# Patient Record
Sex: Female | Born: 1995 | Race: White | Hispanic: No | Smoking: Current every day smoker
Health system: Southern US, Community
[De-identification: ages and names within clinical notes are randomized; demographics above are authoritative.]

## PROBLEM LIST (undated history)

## (undated) DIAGNOSIS — K589 Irritable bowel syndrome without diarrhea: Secondary | ICD-10-CM

## (undated) DIAGNOSIS — E785 Hyperlipidemia, unspecified: Secondary | ICD-10-CM

## (undated) DIAGNOSIS — F32A Depression, unspecified: Secondary | ICD-10-CM

## (undated) DIAGNOSIS — F329 Major depressive disorder, single episode, unspecified: Secondary | ICD-10-CM

## (undated) HISTORY — DX: Major depressive disorder, single episode, unspecified: F32.9

## (undated) HISTORY — DX: Hyperlipidemia, unspecified: E78.5

## (undated) HISTORY — DX: Irritable bowel syndrome, unspecified: K58.9

## (undated) HISTORY — DX: Depression, unspecified: F32.A

---
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
## 2015-03-10 ENCOUNTER — Ambulatory Visit (INDEPENDENT_AMBULATORY_CARE_PROVIDER_SITE_OTHER): Payer: BLUE CROSS/BLUE SHIELD | Admitting: Family Medicine

## 2015-03-10 ENCOUNTER — Encounter: Payer: Self-pay | Admitting: Family Medicine

## 2015-03-10 VITALS — BP 100/64 | Ht 64.5 in | Wt 120.0 lb

## 2015-03-10 DIAGNOSIS — M6249 Contracture of muscle, multiple sites: Secondary | ICD-10-CM | POA: Diagnosis not present

## 2015-03-10 DIAGNOSIS — M62838 Other muscle spasm: Secondary | ICD-10-CM | POA: Insufficient documentation

## 2015-03-10 MED ORDER — NAPROXEN 500 MG PO TABS: 500.0000 mg | ORAL_TABLET | Freq: Two times a day (BID) | ORAL | Status: DC

## 2015-03-10 MED ORDER — PREDNISONE 20 MG PO TABS: ORAL_TABLET | ORAL | Status: DC

## 2015-03-10 NOTE — Patient Instructions (Signed)
Take prednisone as directed below. Use heat followed by stretching If symptoms are not improving after 10-14 days make an appointment for physical therapy If symptoms do not improve after 4-5 sessions of physical therapy make a follow-up appointment  Prednisone: Take 2 tablets (40mg ) for 3 days then 1 tablet (20mg ) for 4 days then stop
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
## 2015-05-01 ENCOUNTER — Ambulatory Visit: Payer: BLUE CROSS/BLUE SHIELD | Attending: Family Medicine | Admitting: Physical Therapy

## 2015-05-01 DIAGNOSIS — M542 Cervicalgia: Secondary | ICD-10-CM
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
## 2015-05-03 ENCOUNTER — Ambulatory Visit: Payer: BLUE CROSS/BLUE SHIELD | Admitting: Physical Therapy

## 2015-05-03 ENCOUNTER — Encounter: Payer: Self-pay | Admitting: Physical Therapy

## 2015-05-03 DIAGNOSIS — M542 Cervicalgia: Secondary | ICD-10-CM | POA: Diagnosis not present

## 2015-05-03 NOTE — Patient Instructions (Signed)
AROM: Neck Rotation   Turn head slowly to look over one shoulder, then the other. Hold each position _10___ seconds. Repeat _5___ times per set. Do __2__ sets per session. Do _2-3___ sessions per day.  http://orth.exer.us/294   Copyright  VHI. All rights reserved.  AROM: Lateral Neck Flexion   Slowly tilt head toward one shoulder, then the other. Hold each position _10___ seconds. Repeat __5__ times per set. Do __2__ sets per session. Do __2-3__ sessions per day.  http://orth.exer.us/296   Copyright  VHI. All rights reserved.  Stretch Break - Chin Tuck   Looking straight forward, tuck chin and hold __10__ seconds. Relax and return to starting position. Repeat __5-10__ times every _3-4___ hours.  Copyright  VHI. All rights reserved.  Stretch Break - Chest and Shoulder Stretch   Maintaining erect posture, draw shoulders back while bringing elbows back and inward. Return to starting position. Repeat __10-20__ times every _3-4___ hours.  Copyright  VHI. All rights reserved.    

## 2015-05-03 NOTE — Therapy (Signed)
[facility] [place], [place], [address] Phone: [phone]   Fax:  [phone]  Physical Therapy Treatment  Patient Details  Name: Diane Russo MRN: [id number] Date of Birth: [birth year] Referring Provider:  Henreitta Cea, MD  Encounter Date: 05/03/2015      PT End of Session - 05/03/15 1158    Visit Number 2   Number of Visits 12   Date for PT Re-Evaluation 06/26/15   PT Start Time 1115   PT Stop Time 1158   PT Time Calculation (min) 43 min   Activity Tolerance Patient tolerated treatment well   Behavior During Therapy [facility] for tasks assessed/performed      History reviewed. No pertinent past medical history.  History reviewed. No pertinent past surgical history.  There were no vitals filed for this visit.  Visit Diagnosis:  Neck pain      Subjective Assessment - 05/03/15 1120    Subjective pain range is 0 up to 8/10 esp when at work.    Patient Stated Goals Have no neck pain.   Currently in Pain? Yes   Pain Score 8    Pain Location Neck   Pain Orientation Right;Left   Pain Descriptors / Indicators Aching   Pain Onset More than a month ago   Aggravating Factors  work activities   Pain Relieving Factors rest                         [facility] Adult PT Treatment/Exercise - 05/03/15 0001    Exercises   Exercises Neck   Neck Exercises: Seated   Cervical Rotation Both;10 reps   Lateral Flexion Both;10 reps   Other Seated Exercise chin tucks 10sec x10   Other Seated Exercise scap retraction 2x10   Modalities   Modalities Moist Heat;Electrical Stimulation;Ultrasound   Moist Heat Therapy   Number Minutes Moist Heat 15 Minutes   Moist Heat Location Cervical   Electrical Stimulation   Electrical Stimulation Location cercical   Electrical Stimulation Action premod   Electrical Stimulation Parameters 1-10HZ    Electrical Stimulation Goals Pain   Ultrasound   Ultrasound Location c-spine  paraspinals   Ultrasound Parameters 1.5w/cm2/50%/65mz x 10 min   Ultrasound Goals Pain                PT Education - 05/03/15 1131    Education provided Yes   Education Details HEP Posture   Person(s) Educated Patient   Methods Explanation;Demonstration;Handout   Comprehension Verbalized understanding;Returned demonstration             PT Long Term Goals - 05/01/15 1150    PT LONG TERM GOAL #1   Title Ind with HEP.   Time 6   Period Weeks   Status New   PT LONG TERM GOAL #2   Title Bilateral active cervical rotation to 85 degrees.   Time 6   Period Weeks   PT LONG TERM GOAL #3   Title Perform ADL's with pain not > 2/10.   Time 6   Period Weeks   Status New   PT LONG TERM GOAL #4   Title Perform work actiivties with pain not > 2/10.   Time 6   Period Weeks   Status New               Plan - 05/03/15 1159    Clinical Impression Statement Patient tolerated treatment very well today. Educated patient with posture techniques  and cervical ex's with HEP given. Pain can increase very high after and while at work. no further goals met today due to ROM and pain deficits.   Pt will benefit from skilled therapeutic intervention in order to improve on the following deficits Pain;Decreased activity tolerance   Rehab Potential Excellent   PT Frequency 2x / week   PT Duration 6 weeks   PT Treatment/Interventions Electrical Stimulation;Moist Heat;Ultrasound;Therapeutic exercise;Manual techniques   PT Next Visit Plan Modalities to bilateral UT's and STW/M and McKenzie exercises.   Consulted and Agree with Plan of Care Patient        Problem List Patient Active Problem List   Diagnosis Date Noted  . Cervical paraspinal muscle spasm 03/10/2015    Phillips Climes, PTA 05/03/2015, 12:05 PM  [facility] [address] [place], [place], [address] Phone: [phone]   Fax:  [phone]

## 2015-05-05 ENCOUNTER — Ambulatory Visit: Payer: BLUE CROSS/BLUE SHIELD | Admitting: Physical Therapy

## 2015-05-05 ENCOUNTER — Encounter: Payer: Self-pay | Admitting: Physical Therapy

## 2015-05-05 DIAGNOSIS — M542 Cervicalgia: Secondary | ICD-10-CM | POA: Diagnosis not present

## 2015-05-05 NOTE — Therapy (Signed)
[facility] [place], [place], [address] Phone: [phone]   Fax:  [phone]  Physical Therapy Treatment  Patient Details  Name: Diane Russo MRN: [id number] Date of Birth: 1996/05/26 Referring Provider:  Henreitta Cea, MD  Encounter Date: 05/05/2015      PT End of Session - 05/05/15 1046    Visit Number 3   Number of Visits 12   Date for PT Re-Evaluation 06/26/15   PT Start Time 1030   PT Stop Time 1059   PT Time Calculation (min) 29 min   Activity Tolerance Patient tolerated treatment well   Behavior During Therapy [facility] for tasks assessed/performed      History reviewed. No pertinent past medical history.  History reviewed. No pertinent past surgical history.  There were no vitals filed for this visit.  Visit Diagnosis:  Neck pain      Subjective Assessment - 05/05/15 1033    Subjective less popping in neck noted. 0 up to 8/10 with work activity. Has not went back to work yet work all weekend   Currently in Pain? Yes   Pain Score 8    Pain Location Neck   Pain Orientation Right;Left   Pain Descriptors / Indicators Aching   Pain Onset More than a month ago   Aggravating Factors  work activities   Pain Relieving Factors rest                         [facility] Adult PT Treatment/Exercise - 05/05/15 0001    Neck Exercises: Seated   Cervical Isometrics --   Modalities   Modalities Moist Heat;Electrical Stimulation;Ultrasound   Moist Heat Therapy   Number Minutes Moist Heat 15 Minutes   Moist Heat Location Cervical   Electrical Stimulation   Electrical Stimulation Action premod   Electrical Stimulation Parameters 1-_0    Electrical Stimulation Goals Pain   Ultrasound   Ultrasound Location c-spine paraspinals   Ultrasound Parameters 1.5w/cm2/50%/45mz x 10 min   Ultrasound Goals Pain                     PT Long Term Goals - 05/05/15 1050    PT LONG TERM GOAL #1   Title Ind with HEP.   Time 6   Period Weeks   Status On-going   PT LONG TERM GOAL #2   Title Bilateral active cervical rotation to 85 degrees.   Time 6   Period Weeks   Status On-going   PT LONG TERM GOAL #3   Title Perform ADL's with pain not > 2/10.   Time 6   Period Weeks   Status On-going   PT LONG TERM GOAL #4   Title Perform work actiivties with pain not > 2/10.   Time 6   Period Weeks   Status On-going  8/10               Plan - 05/05/15 1047    Clinical Impression Statement Pateint tolerated treatment very well today. No exercises today due to patient working this weekend. Will see how patient feels next week after work schedule and may add more ther ex if patient pain level has decreased. No further goals met today due to pain limitration with work activity. Slight forward posture today.   Pt will benefit from skilled therapeutic intervention in order to improve on the following deficits Pain;Decreased activity tolerance   Rehab Potential Excellent   PT  Frequency 2x / week   PT Duration 6 weeks   PT Treatment/Interventions Electrical Stimulation;Moist Heat;Ultrasound;Therapeutic exercise;Manual techniques   PT Next Visit Plan Modalities to bilateral UT's and STW/M and McKenzie exercises. (cervical isometrics and UBE with posture focus)   Consulted and Agree with Plan of Care Patient        Problem List Patient Active Problem List   Diagnosis Date Noted  . Cervical paraspinal muscle spasm 03/10/2015    Phillips Climes, PTA 05/05/2015, 11:08 AM  [facility] [place], [place], [address] Phone: [phone]   Fax:  [phone]

## 2015-05-08 ENCOUNTER — Encounter: Payer: Self-pay | Admitting: *Deleted

## 2015-05-08 ENCOUNTER — Ambulatory Visit: Payer: BLUE CROSS/BLUE SHIELD | Admitting: *Deleted

## 2015-05-08 DIAGNOSIS — M542 Cervicalgia: Secondary | ICD-10-CM

## 2015-05-08 NOTE — Therapy (Signed)
[facility] [address] [place], [place], [address] Phone: [phone]   Fax:  [phone]  Physical Therapy Treatment  Patient Details  Name: Diane Russo MRN: [id number] Date of Birth: [birth year] Referring Provider:  Duard Brady, MD  Encounter Date: 05/08/2015      PT End of Session - 05/08/15 1131    Visit Number 4   Date for PT Re-Evaluation 06/26/15   PT Start Time 1030   PT Stop Time 1121   PT Time Calculation (min) 51 min      History reviewed. No pertinent past medical history.  History reviewed. No pertinent past surgical history.  There were no vitals filed for this visit.  Visit Diagnosis:  Neck pain      Subjective Assessment - 05/08/15 1044    Subjective less popping in neck noted. 0 up to 8/10 with work activity. Has not went back to work yet work all weekend   Patient Stated Goals Have no neck pain.   Currently in Pain? Yes   Pain Location Neck   Pain Orientation Right;Left   Pain Descriptors / Indicators Aching   Pain Onset More than a month ago   Pain Frequency Constant   Aggravating Factors  work activities   Pain Relieving Factors rest                         [facility] Adult PT Treatment/Exercise - 05/08/15 0001    Modalities   Modalities Moist Heat;Electrical Stimulation;Ultrasound   Moist Heat Therapy   Number Minutes Moist Heat 15 Minutes   Moist Heat Location Cervical   Electrical Stimulation   Electrical Stimulation Location cercical   Electrical Stimulation Action IFC   Electrical Stimulation Parameters 80-150hz    Electrical Stimulation Goals Pain   Ultrasound   Ultrasound Location Cervical paras and Utraps LT/RT   Ultrasound Parameters 1.5 w/cm2 x 10 mins in sitting   Ultrasound Goals Pain   Manual Therapy   Manual Therapy Myofascial release;Soft tissue mobilization   Myofascial Release IASTM and STW to BIl. Utraps, levator, and cevical paras with TPR to cerv  paras in sitting                     PT Long Term Goals - 05/05/15 1050    PT LONG TERM GOAL #1   Title Ind with HEP.   Time 6   Period Weeks   Status On-going   PT LONG TERM GOAL #2   Title Bilateral active cervical rotation to 85 degrees.   Time 6   Period Weeks   Status On-going   PT LONG TERM GOAL #3   Title Perform ADL's with pain not > 2/10.   Time 6   Period Weeks   Status On-going   PT LONG TERM GOAL #4   Title Perform work actiivties with pain not > 2/10.   Time 6   Period Weeks   Status On-going  8/10               Plan - 05/08/15 1132    Clinical Impression Statement Pt did great with Rx today and had good TPR bil cervical paras. She still has a slight rounded shoulder forward head posture, so posture exs were reviewed.Decreased pain after Rx. Notable tightness both sides RT>LT   Pt will benefit from skilled therapeutic intervention in order to improve on the following deficits Pain;Decreased activity tolerance   Rehab Potential  Excellent   PT Frequency 2x / week   PT Duration 6 weeks   PT Treatment/Interventions Electrical Stimulation;Moist Heat;Ultrasound;Therapeutic exercise;Manual techniques   PT Next Visit Plan Modalities to bilateral UT's and STW/M and McKenzie exercises. (cervical isometrics and UBE with posture focus)        Problem List Patient Active Problem List   Diagnosis Date Noted  . Cervical paraspinal muscle spasm 03/10/2015    Kemp Gomes,CHRISPTA 05/08/2015, 11:46 AM  [facility] [address] [place], [place], [address] Phone: [phone]   Fax:  [phone]

## 2015-05-10 ENCOUNTER — Ambulatory Visit: Payer: BLUE CROSS/BLUE SHIELD | Admitting: *Deleted

## 2015-05-10 ENCOUNTER — Encounter: Payer: Self-pay | Admitting: *Deleted

## 2015-05-10 DIAGNOSIS — M542 Cervicalgia: Secondary | ICD-10-CM | POA: Diagnosis not present

## 2015-05-10 NOTE — Therapy (Signed)
[facility] [address] [place], [place], [address] Phone: [phone]   Fax:  [phone]  Physical Therapy Treatment  Patient Details  Name: Diane Russo MRN: [id number] Date of Birth: 11-Feb-1996 Referring Provider:  Duard Brady, MD  Encounter Date: 05/10/2015      PT End of Session - 05/10/15 1257    Visit Number 5   Number of Visits 12   Date for PT Re-Evaluation 06/26/15   PT Start Time 1030   PT Stop Time 1118   PT Time Calculation (min) 48 min      History reviewed. No pertinent past medical history.  History reviewed. No pertinent past surgical history.  There were no vitals filed for this visit.  Visit Diagnosis:  Neck pain      Subjective Assessment - 05/10/15 1231    Subjective Did ok after last Rx. Neck started hurting when looking down rinsing some shirts.   Patient Stated Goals Have no neck pain.   Pain Score 6    Pain Location Neck   Pain Orientation Right;Left   Pain Descriptors / Indicators Aching   Pain Onset More than a month ago   Pain Frequency Intermittent   Aggravating Factors  work ACts   Pain Relieving Factors rest                         [facility] Adult PT Treatment/Exercise - 05/10/15 0001    Exercises   Exercises Neck   Neck Exercises: Standing   Neck Retraction 5 reps   Other Standing Exercises postural stretch with Pt standing. Scapular retractions with shldrs in  ER and focusing on correct postue throughout the day   Neck Exercises: Seated   Postural Training Reviewed correct posture to avoid forward head and rounded shldrs   Other Seated Exercise Levator scapulae stretching x5 hold 30 secs   Manual Therapy   Manual Therapy Myofascial release;Soft tissue mobilization;Passive ROM   Myofascial Release STW to BIl. Utraps, levator, and cevical paras with TPR to cerv paras in supine   Passive ROM Passive cervical stretching for rotation Bilateral.  with  end-range  holds                      PT Long Term Goals - 05/05/15 1050    PT LONG TERM GOAL #1   Title Ind with HEP.   Time 6   Period Weeks   Status On-going   PT LONG TERM GOAL #2   Title Bilateral active cervical rotation to 85 degrees.   Time 6   Period Weeks   Status On-going   PT LONG TERM GOAL #3   Title Perform ADL's with pain not > 2/10.   Time 6   Period Weeks   Status On-going   PT LONG TERM GOAL #4   Title Perform work actiivties with pain not > 2/10.   Time 6   Period Weeks   Status On-going  8/10               Plan - 05/10/15 1258    Clinical Impression Statement Pt did fairly well today with Rx    Pt will benefit from skilled therapeutic intervention in order to improve on the following deficits Pain;Decreased activity tolerance   Rehab Potential Excellent   PT Frequency 2x / week   PT Duration 6 weeks   PT Treatment/Interventions Electrical Stimulation;Moist Heat;Ultrasound;Therapeutic exercise;Manual techniques  PT Next Visit Plan Modalities to bilateral UT's and STW/M and McKenzie exercises. (cervical isometrics and UBE with posture focus) Manual passive stretches        Problem List Patient Active Problem List   Diagnosis Date Noted  . Cervical paraspinal muscle spasm 03/10/2015    RAMSEUR,CHRIS, PTA 05/10/2015, 2:26 PM  [facility] [address] [place], [place], [address] Phone: [phone]   Fax:  [phone]

## 2015-05-16 ENCOUNTER — Encounter: Payer: Self-pay | Admitting: Physical Therapy

## 2015-05-16 ENCOUNTER — Ambulatory Visit: Payer: BLUE CROSS/BLUE SHIELD | Admitting: Physical Therapy

## 2015-05-16 DIAGNOSIS — M542 Cervicalgia: Secondary | ICD-10-CM | POA: Diagnosis not present

## 2015-05-16 NOTE — Therapy (Signed)
[facility] [address] [place], [place], [address] Phone: [phone]   Fax:  [phone]  Physical Therapy Treatment  Patient Details  Name: Diane Russo MRN: [id number] Date of Birth: May 12, 1996 Referring Provider:  Duard Brady, MD  Encounter Date: 05/16/2015      PT End of Session - 05/16/15 1133    Visit Number 6   Number of Visits 12   Date for PT Re-Evaluation 06/26/15   PT Start Time 1117   PT Stop Time 1154   PT Time Calculation (min) 37 min   Activity Tolerance Patient tolerated treatment well   Behavior During Therapy [facility] for tasks assessed/performed      No past medical history on file.  No past surgical history on file.  There were no vitals filed for this visit.  Visit Diagnosis:  Neck pain      Subjective Assessment - 05/16/15 1130    Subjective Patient states that she doesn't hurt too bad today. States that during work activities when pain is bad the pain is a sore feeling and makes her feel like she has to pop her neck. Requests no more STW due to increased pain following last sessions of STW.   Patient Stated Goals Have no neck pain.   Currently in Pain? Yes   Pain Score 2    Pain Location Neck   Pain Orientation Right   Pain Descriptors / Indicators Dull   Pain Onset More than a month ago            [facility] PT Assessment - 05/16/15 0001    Assessment   Medical Diagnosis Cervical paraspinal muscle spasm.   Onset Date/Surgical Date 01/20/15                     [facility] Adult PT Treatment/Exercise - 05/16/15 0001    Modalities   Modalities Electrical Stimulation;Moist Heat;Ultrasound   Moist Heat Therapy   Number Minutes Moist Heat 20 Minutes   Moist Heat Location Cervical   Electrical Stimulation   Electrical Stimulation Location R Upper Trap   Electrical Stimulation Action Pre-Mod   Electrical Stimulation Parameters 80-150 Hz x20 min   Electrical Stimulation Goals Pain   Ultrasound   Ultrasound Location R UT   Ultrasound Parameters 1.5 w/cm2, 100%, 1 mhz   Ultrasound Goals Pain                PT Education - 05/16/15 1202    Education provided Yes   Education Details Encouraged patient to be aware of posture and HEP completion.   Person(s) Educated Patient   Methods Explanation   Comprehension Verbalized understanding             PT Long Term Goals - 05/05/15 1050    PT LONG TERM GOAL #1   Title Ind with HEP.   Time 6   Period Weeks   Status On-going   PT LONG TERM GOAL #2   Title Bilateral active cervical rotation to 85 degrees.   Time 6   Period Weeks   Status On-going   PT LONG TERM GOAL #3   Title Perform ADL's with pain not > 2/10.   Time 6   Period Weeks   Status On-going   PT LONG TERM GOAL #4   Title Perform work actiivties with pain not > 2/10.   Time 6   Period Weeks   Status On-going  8/10  Plan - 05/16/15 1155    Clinical Impression Statement Patient tolerated treatment well today wihtout complaint of increased pain during treatment. Modalities completed with no STW per patient request. Trigger point noted in R Upper Trapezius during ultrasound as the soundhead passed over it. No reports of tenderness or pain expressed during treatment. Normal modaltiies respones noted following removal of the modalties. Denied pain following treatment and reported decrease tightness.   Pt will benefit from skilled therapeutic intervention in order to improve on the following deficits Pain;Decreased activity tolerance   Rehab Potential Excellent   PT Frequency 2x / week   PT Duration 6 weeks   PT Treatment/Interventions Electrical Stimulation;Moist Heat;Ultrasound;Therapeutic exercise;Manual techniques   PT Next Visit Plan Continue per MPT POC.   Consulted and Agree with Plan of Care Patient        Problem List Patient Active Problem List   Diagnosis Date Noted  . Cervical paraspinal muscle spasm  03/10/2015    Evelene CroonKelsey M Parsons, PTA 05/16/2015, 12:04 PM  [facility] [address] [place], [place], [address] Phone: [phone]   Fax:  [phone]

## 2015-05-18 ENCOUNTER — Ambulatory Visit: Payer: BLUE CROSS/BLUE SHIELD | Admitting: Physical Therapy

## 2015-05-18 DIAGNOSIS — M542 Cervicalgia: Secondary | ICD-10-CM | POA: Diagnosis not present

## 2015-05-18 NOTE — Therapy (Addendum)
[facility] [place], [place], [address] Phone: [phone]   Fax:  [phone]  Physical Therapy Treatment  Patient Details  Name: Diane Russo MRN: [id number] Date of Birth: [birth year] Referring Provider:  Henreitta Cea, MD  Encounter Date: 05/18/2015      PT End of Session - 05/18/15 0948    Visit Number 7   Number of Visits 12   Date for PT Re-Evaluation 06/26/15   PT Start Time 0947   PT Stop Time 1029   PT Time Calculation (min) 42 min   Activity Tolerance Patient tolerated treatment well   Behavior During Therapy [facility] for tasks assessed/performed      No past medical history on file.  No past surgical history on file.  There were no vitals filed for this visit.  Visit Diagnosis:  Neck pain      Subjective Assessment - 05/18/15 0948    Subjective Patient has no pain today.                          [facility] Adult PT Treatment/Exercise - 05/18/15 0001    Exercises   Exercises Shoulder   Neck Exercises: Theraband   Horizontal ABduction --  2# bil   Neck Exercises: Seated   Neck Retraction 10 reps  VCs to avoid excessive protraction   Neck Exercises: Prone   Rows 20 reps;Weights   Rows Weights (lbs) 4   Other Prone Exercise T's 2# and 1# 1 x 10 and Ys 1# 2 x 10  feels in lev scap (not pain)   Shoulder Exercises: Supine   Horizontal ABduction Strengthening;Both;20 reps;Weights   Horizontal ABduction Weight (lbs) 2  Ts and Ys   External Rotation Strengthening;Both;20 reps;Weights  Robber position   External Rotation Weight (lbs) 2   Shoulder Exercises: Standing   Horizontal ABduction Strengthening;Both;20 reps;Weights   Horizontal ABduction Weight (lbs) 2   Other Standing Exercises Flex/Scap/Abd 2# x 10 reps each   Other Standing Exercises lawnmower from plynth 2 # x 20    Manual Therapy   Manual Therapy Taping   Kinesiotex Inhibit Muscle   Kinesiotix   Inhibit Muscle  Rock  tape to Rt lev scap and across upper back Acromion to acromion   Neck Exercises: Stretches   Levator Stretch 1 rep;30 seconds  reviewed for proper form                PT Education - 05/18/15 1057    Education provided Yes   Education Details T's/Ys; rows, Gaffer, Equities trader for [employer]; [employer] education precautions; how to take off   Person(s) Educated Patient   Methods Explanation;Demonstration;Handout   Comprehension Verbalized understanding;Returned demonstration             PT Long Term Goals - 05/18/15 1104    PT LONG TERM GOAL #1   Title Ind with HEP.   Time 6   Period Weeks   Status On-going   PT LONG TERM GOAL #3   Title Perform ADL's with pain not > 2/10.   Time 6   Period Weeks   Status On-going   PT LONG TERM GOAL #4   Title Perform work actiivties with pain not > 2/10.   Time 6   Period Weeks   Status On-going               Plan - 05/18/15 1100    Clinical Impression Statement Patient  had no paiin today, but hadn't worked yet. Patient tolerated introduction of shoulder strengthening without increased pain. Patient demos good scapular control with therex. Rock tape applied to right [employer] and across upper back to see if helps control pain at work later today.  HEP progressed to shoulder and upper back strengthening.   PT Next Visit Plan assess rock tape, continue strengthening; modalities prn   Consulted and Agree with Plan of Care Patient        Problem List Patient Active Problem List   Diagnosis Date Noted  . Cervical paraspinal muscle spasm 03/10/2015    Madelyn Flavors PT  05/18/2015, 11:06 AM  [facility] [place], [place], [address] Phone: [phone]   Fax:  [phone] PHYSICAL THERAPY DISCHARGE SUMMARY  Visits from Start of Care:  7.  Current functional level related to goals / functional outcomes: Please see above.   Remaining deficits: Continued neck  pain.   Education / Equipment: HEP. Plan: Patient agrees to discharge.  Patient goals were not met. Patient is being discharged due to being pleased with the current functional level.  ?????        Mali Applegate MPT

## 2015-05-18 NOTE — Patient Instructions (Signed)
Abduction: Horizontal - Prone (Dumbbell)   Lie with right arm hanging down. Lift arm out to side, PALM DOWN. Repeat _10___ times per set. Do __1-3__ sets per session. Do __1__ sessions per day. Use _1___ lb weight.  Repeat with palm facing down toward floor.  Straight Arm Lift: Prone   Arm straight out diagonally. Keeping PALM DOWN, lift arm. Keep pelvis still. You can add light weight. (1#) Do _10-30__ times, 1_ times per day.   http://ss.exer.us/311   Robber: Scapular Retraction: Elbow Flexion (SITTING)   With elbows bent to 90, pinch shoulder blades together and rotate arms out, keeping elbows bent. Repeat _10___ times per set. Do _1-3___ sets per session. Do __1__ sessions per day. May use _1-2____ pound weights.  http://orth.exer.us/948   Copyright  VHI. All rights reserved.   LAWNMOWER!!! With 2# Weight ROWS 4# LAYING ON STOMACH  Solon PalmJulie Alec Mcphee, PT 05/18/2015 10:29 AM [facility] [address] [place], [place], [address] Phone: [phone]   Fax:  [phone]

## 2015-11-21 ENCOUNTER — Ambulatory Visit (INDEPENDENT_AMBULATORY_CARE_PROVIDER_SITE_OTHER): Payer: BLUE CROSS/BLUE SHIELD | Admitting: Sports Medicine

## 2015-11-21 ENCOUNTER — Encounter: Payer: Self-pay | Admitting: Sports Medicine

## 2015-11-21 VITALS — BP 100/70 | Ht 64.5 in | Wt 120.0 lb

## 2015-11-21 DIAGNOSIS — M6249 Contracture of muscle, multiple sites: Secondary | ICD-10-CM | POA: Diagnosis not present

## 2015-11-21 DIAGNOSIS — S161XXD Strain of muscle, fascia and tendon at neck level, subsequent encounter: Secondary | ICD-10-CM

## 2015-11-21 DIAGNOSIS — M62838 Other muscle spasm: Secondary | ICD-10-CM
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
## 2015-11-25 ENCOUNTER — Ambulatory Visit (HOSPITAL_BASED_OUTPATIENT_CLINIC_OR_DEPARTMENT_OTHER): Payer: BLUE CROSS/BLUE SHIELD

## 2015-12-02 ENCOUNTER — Ambulatory Visit (HOSPITAL_BASED_OUTPATIENT_CLINIC_OR_DEPARTMENT_OTHER): Payer: BLUE CROSS/BLUE SHIELD

## 2015-12-16 ENCOUNTER — Ambulatory Visit (HOSPITAL_BASED_OUTPATIENT_CLINIC_OR_DEPARTMENT_OTHER)
Admission: RE | Admit: 2015-12-16 | Discharge: 2015-12-16 | Disposition: A | Discharge status: Home / Self Care | Payer: BLUE CROSS/BLUE SHIELD | Source: Ambulatory Visit | Attending: Sports Medicine | Admitting: Sports Medicine

## 2015-12-16 DIAGNOSIS — M62838 Other muscle spasm: Secondary | ICD-10-CM

## 2015-12-16 DIAGNOSIS — M542 Cervicalgia: Secondary | ICD-10-CM | POA: Insufficient documentation

## 2015-12-16 DIAGNOSIS — M6249 Contracture of muscle, multiple sites: Secondary | ICD-10-CM | POA: Diagnosis not present

## 2015-12-19 ENCOUNTER — Telehealth: Payer: Self-pay | Admitting: Sports Medicine

## 2015-12-19 NOTE — Telephone Encounter (Signed)
I left a message on the patient's voicemail today regarding MRI findings of her cervical spine. It is unremarkable. She is reassured regarding these findings. I also explained that it is not unusual for these whiplash type of injuries to take several months before resolving. I think she can continue with activity as tolerated and follow-up with me as needed.

## 2016-01-06 LAB — BASIC METABOLIC PANEL
BUN: 6 mg/dL (ref 4–21)
CREATININE: 0.7 mg/dL (ref 0.5–1.1)
GLUCOSE: 72 mg/dL

## 2016-01-06 LAB — HEPATIC FUNCTION PANEL
ALT: 22 U/L (ref 3–30)
AST: 19 U/L (ref 2–40)
Alkaline Phosphatase: 68 U/L (ref 25–125)
BILIRUBIN, TOTAL: 0.4 mg/dL

## 2016-01-06 LAB — LIPID PANEL
Cholesterol: 212 mg/dL — AB (ref 0–200)
HDL: 48 mg/dL (ref 35–70)
LDL Cholesterol: 134 mg/dL
TRIGLYCERIDES: 151 mg/dL (ref 40–160)

## 2016-01-06 LAB — CBC AND DIFFERENTIAL
HEMATOCRIT: 40 % (ref 36–46)
Hemoglobin: 13.5 g/dL (ref 12.0–16.0)
PLATELETS: 250 10*3/uL (ref 150–399)
WBC: 6.6 10^3/mL

## 2016-01-23 ENCOUNTER — Encounter: Payer: Self-pay | Admitting: Internal Medicine

## 2016-01-23 ENCOUNTER — Ambulatory Visit (INDEPENDENT_AMBULATORY_CARE_PROVIDER_SITE_OTHER): Payer: BLUE CROSS/BLUE SHIELD | Admitting: Internal Medicine

## 2016-01-23 VITALS — BP 110/76 | HR 85 | Temp 98.2°F | Resp 16 | Ht 65.75 in | Wt 116.0 lb

## 2016-01-23 DIAGNOSIS — K589 Irritable bowel syndrome without diarrhea: Secondary | ICD-10-CM | POA: Diagnosis not present

## 2016-01-23 DIAGNOSIS — M6249 Contracture of muscle, multiple sites: Secondary | ICD-10-CM | POA: Diagnosis not present

## 2016-01-23 DIAGNOSIS — L748 Other eccrine sweat disorders: Secondary | ICD-10-CM | POA: Diagnosis not present

## 2016-01-23 DIAGNOSIS — F419 Anxiety disorder, unspecified: Secondary | ICD-10-CM | POA: Insufficient documentation

## 2016-01-23 DIAGNOSIS — M62838 Other muscle spasm: Secondary | ICD-10-CM

## 2016-01-23 MED ORDER — ALUMINUM CHLORIDE 20 % EX SOLN: Freq: Every day | CUTANEOUS | Status: AC

## 2016-01-23 NOTE — Progress Notes (Signed)
Pre visit review using our clinic review tool, if applicable. No additional management support is needed unless otherwise documented below in the visit note. 

## 2016-01-23 NOTE — Patient Instructions (Signed)
  We will try a new prescription deodorant.  If it does not work please call me.     Your prescription(s) have been submitted to your pharmacy. Please take as directed and contact our office if you believe you are having problem(s) with the medication(s).   Please followup as needed.
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
## 2016-01-23 NOTE — Assessment & Plan Note (Signed)
Sees psychiatry and they prescribe the medication Will be starting counseling

## 2016-01-23 NOTE — Assessment & Plan Note (Addendum)
Has had full GI evaluation in the past Has abdominal cramping and constipation

## 2016-01-23 NOTE — Assessment & Plan Note (Signed)
Has some degree of hyperhidrosis, but the odor is more of the concern Will try drysol If no improvement she will call ? Bacterial overgrowth in sweat glands - I would expect more physical signs with redness or bumps and she denies this May need derm referral

## 2016-02-08 ENCOUNTER — Encounter: Payer: Self-pay | Admitting: Internal Medicine

## 2016-02-13 ENCOUNTER — Encounter: Payer: Self-pay | Admitting: Sports Medicine

## 2016-02-16 ENCOUNTER — Ambulatory Visit (INDEPENDENT_AMBULATORY_CARE_PROVIDER_SITE_OTHER): Payer: BLUE CROSS/BLUE SHIELD | Admitting: Family Medicine

## 2016-02-16 ENCOUNTER — Telehealth: Payer: Self-pay | Admitting: Internal Medicine

## 2016-02-16 ENCOUNTER — Encounter: Payer: Self-pay | Admitting: Family Medicine

## 2016-02-16 ENCOUNTER — Encounter: Payer: Self-pay | Admitting: *Deleted

## 2016-02-16 VITALS — BP 118/82 | HR 80 | Temp 98.8°F | Resp 16 | Ht 65.75 in | Wt 111.2 lb

## 2016-02-16 DIAGNOSIS — J029 Acute pharyngitis, unspecified: Secondary | ICD-10-CM | POA: Diagnosis not present

## 2016-02-16 LAB — POCT RAPID STREP A (OFFICE): Rapid Strep A Screen: NEGATIVE

## 2016-02-16 MED ORDER — CLINDAMYCIN HCL 300 MG PO CAPS
300.0000 mg | ORAL_CAPSULE | Freq: Three times a day (TID) | ORAL | Status: DC
Start: 2016-02-16 — End: 2022-03-13

## 2016-02-16 NOTE — Telephone Encounter (Signed)
[facility] - Client TELEPHONE ADVICE RECORD [facility]  Patient Name: Diane FillerMILY SHELTON  DOB: 1996-09-26    Initial Comment Caller states, dtr sore throat, headache, fever, needs a sick visit    Nurse Assessment  Nurse: Laural BenesJohnson, RN, Dondra SpryGail Date/Time Lamount Cohen(Eastern Time): 02/16/2016 8:35:44 AM  Confirm and document reason for call. If symptomatic, describe symptoms. You must click the next button to save text entered. ---Irving BurtonEmily has bad sore throat, getting worse last two days fever and headache present .  Has the patient traveled out of the country within the last 30 days? ---No  Does the patient have any new or worsening symptoms? ---Yes  Will a triage be completed? ---Yes  Related visit to physician within the last 2 weeks? ---No  Does the PT have any chronic conditions? (i.e. diabetes, asthma, etc.) ---No  Is the patient pregnant or possibly pregnant? (Ask all females between the ages of 4712-55) ---No  Is this a behavioral health or substance abuse call? ---No     Guidelines    Guideline Title Affirmed Question Affirmed Notes  Sore Throat SEVERE (e.g., excruciating) throat pain    Final Disposition User   See Physician within 24 Hours [place], RN, Dondra SpryGail    Comments  NOTE; [facility]/16/2016 1100am [place], PHILLIP Diane FillerEmily Shelton r.o strep severe sore throat, headache low grade fever   Referrals  REFERRED TO PCP OFFICE   Disagree/Comply: Comply

## 2016-02-16 NOTE — Telephone Encounter (Signed)
ok 

## 2016-02-16 NOTE — Progress Notes (Signed)
Pre visit review using our clinic review tool, if applicable. No additional management support is needed unless otherwise documented below in the visit note. 

## 2016-02-16 NOTE — Telephone Encounter (Signed)
Spoke with pts mother, she informed that [facility].

## 2016-02-16 NOTE — Telephone Encounter (Signed)
Please advise, only possible opening is at [address] which is currently blocked.

## 2016-02-16 NOTE — Progress Notes (Signed)
OFFICE VISIT  02/16/2016   CC:  Chief Complaint  Patient presents with  . Sore Throat    x 2 days   HPI:    Patient is a 20 y.o. Caucasian female who presents for ST that started 2 d/a, getting worse.  +HA.  No nasal sx's or cough.  Fatigue but no body aches.  Fever to 101.5 yesterday. Tylenol/ibuprofen is being tried.  Also tried allergy pill for ST but no help. +sick contacts with similar sx's where she goes to school.  No rash.  No sob or wheezing.  Past Medical History  Diagnosis Date  . Depression   . Hyperlipidemia   . IBS (irritable bowel syndrome)     Past Surgical History  Procedure Laterality Date  . Wisdom tooth extraction  08/2014    Outpatient Prescriptions Prior to Visit  Medication Sig Dispense Refill  . aluminum chloride (DRYSOL) 20 % external solution Apply topically at bedtime. 35 mL 0  . DULoxetine (CYMBALTA) 60 MG capsule Take 60 mg by mouth daily.    Marland Kitchen. NORTREL 1/35, 28, tablet   0   No facility-administered medications prior to visit.    Allergies  Allergen Reactions  . Azithromycin Hives and Rash  . Penicillins Hives and Rash    ROS As per HPI  PE: Blood pressure 118/82, pulse 80, temperature 98.8 F (37.1 C), temperature source Oral, resp. rate 16, height 5' 5.75" (1.67 m), weight 111 lb 4 oz (50.463 kg), last menstrual period 02/02/2016, SpO2 97 %. Gen: Alert, well appearing.  Patient is oriented to person, place, time, and situation. ENT: Ears: EACs clear, normal epithelium.  TMs with good light reflex and landmarks bilaterally.  Eyes: no injection, icteris, swelling, or exudate.  EOMI, PERRLA. Nose: no drainage or turbinate edema/swelling.  No injection or focal lesion.  Mouth: lips without lesion/swelling.  Oral mucosa pink and moist.  Dentition intact and without obvious caries or gingival swelling.  Oropharynx without exudate or swelling.  There is a hint of spotty erythema on soft palate, ? Rare petechiae? Neck - No masses or  thyromegaly or limitation in range of motion.  She has diffuse, mildly tender anterior cerv LAD. CV: RRR, no m/r/g.   LUNGS: CTA bilat, nonlabored resps, good aeration in all lung fields. EXT: no clubbing, cyanosis, or edema.  Skin - no sores or suspicious lesions or rashes or color changes   LABS:  Rapid strep NEG  IMPRESSION AND PLAN:  Acute pharyngitis illness, rapid strep negative but I'm still suspicious of this being group A strep infection.   Influenza also possible but less likely given lack of cough + no body aches. Will start clindamycin (see drug allergies) and send group A strep culture.  An After Visit Summary was printed and given to the patient.  FOLLOW UP: Return if symptoms worsen or fail to improve.  Signed:  Santiago BumpersPhil Lilas Diefendorf, MD           02/16/2016

## 2016-02-16 NOTE — Telephone Encounter (Signed)
States patient is in [place] but is having fever and sore throat.  Mother does not want daughter to be seen by anyone else but Dr. Lawerance BachBurns.  Is requesting Dr. Lawerance BachBurns to work patient in today.

## 2016-02-18 LAB — CULTURE, GROUP A STREP: ORGANISM ID, BACTERIA: NORMAL

## 2016-02-22 ENCOUNTER — Telehealth: Payer: Self-pay | Admitting: Internal Medicine

## 2016-02-22 MED ORDER — POLYMYXIN B-TRIMETHOPRIM 10000-0.1 UNIT/ML-% OP SOLN: 1.0000 [drp] | OPHTHALMIC | Status: AC

## 2016-02-22 NOTE — Telephone Encounter (Signed)
Unable to leave VM on pts phone. Spoke with pts mother to inform RX was sent to [facility]

## 2016-02-22 NOTE — Telephone Encounter (Signed)
Please advise 

## 2016-02-22 NOTE — Telephone Encounter (Signed)
Antibiotic eye drop sent to pharmacy on file.  Use in affected eye ( ? Which one - rx says right)  Take for 5-7 days.  If no improvement needs to be seen.

## 2016-02-22 NOTE — Telephone Encounter (Signed)
Patient Name: Haywood FillerMILY SHELTON DOB: [birth year] Initial Comment Caller states [facility], rx abx. Now congested with eye itching. Now red, puffy, and swollen. Nurse Assessment Nurse: Roma KayserForsythe, RN, Santina Evansatherine Date/Time (Eastern Time): 02/22/2016 10:57:28 AM Confirm and document reason for call. If symptomatic, describe symptoms. You must click the next button to save text entered. ---caller states she was seen friday and dx strep started on abx. and yesterday started with one eye swelling and green discharge this morning, eye is itchy, states that she thinks she has pink eye. Has the patient traveled out of the country within the last 30 days? ---Not Applicable Does the patient have any new or worsening symptoms? ---Yes Will a triage be completed? ---Yes Related visit to physician within the last 2 weeks? ---No Does the PT have any chronic conditions? (i.e. diabetes, asthma, etc.) ---Unknown Is the patient pregnant or possibly pregnant? (Ask all females between the ages of 3612-55) ---No Is this a behavioral health or substance abuse call? ---No Guidelines Guideline Title Affirmed Question Affirmed Notes Eye - Pus or Discharge Blurred vision Final Disposition User See Physician within 4 Hours (or PCP triage) Roma KayserForsythe, RN, Santina Evansatherine Comments no appts left in her pcp office today and does not want to go to a different PCP office, is not sure what she is going to do. Referrals GO TO FACILITY UNDECIDED Disagree/Comply: Comply

## 2016-02-23 ENCOUNTER — Telehealth: Payer: Self-pay

## 2016-02-23 NOTE — Telephone Encounter (Signed)
-----   Message from Jeoffrey MassedPhilip H McGowen, MD sent at 02/19/2016  8:03 AM EDT ----- No sign of bacterial infection on throat culture. Pt may stop her antibiotics.-thx

## 2021-08-13 DIAGNOSIS — Z113 Encounter for screening for infections with a predominantly sexual mode of transmission: Secondary | ICD-10-CM | POA: Diagnosis not present

## 2021-08-13 DIAGNOSIS — Z0389 Encounter for observation for other suspected diseases and conditions ruled out: Secondary | ICD-10-CM | POA: Diagnosis not present

## 2021-08-13 DIAGNOSIS — Z3009 Encounter for other general counseling and advice on contraception: Secondary | ICD-10-CM | POA: Diagnosis not present

## 2021-08-13 DIAGNOSIS — Z3041 Encounter for surveillance of contraceptive pills: Secondary | ICD-10-CM | POA: Diagnosis not present

## 2021-08-13 DIAGNOSIS — Z1388 Encounter for screening for disorder due to exposure to contaminants: Secondary | ICD-10-CM | POA: Diagnosis not present

## 2021-08-13 DIAGNOSIS — Z114 Encounter for screening for human immunodeficiency virus [HIV]: Secondary | ICD-10-CM | POA: Diagnosis not present

## 2021-08-13 DIAGNOSIS — Z01419 Encounter for gynecological examination (general) (routine) without abnormal findings: Secondary | ICD-10-CM | POA: Diagnosis not present

## 2021-08-13 DIAGNOSIS — R87618 Other abnormal cytological findings on specimens from cervix uteri: Secondary | ICD-10-CM | POA: Diagnosis not present

## 2022-03-13 ENCOUNTER — Other Ambulatory Visit: Payer: Self-pay

## 2022-03-13 ENCOUNTER — Encounter (HOSPITAL_COMMUNITY): Payer: Self-pay

## 2022-03-13 ENCOUNTER — Emergency Department (HOSPITAL_COMMUNITY): Payer: BC Managed Care – PPO

## 2022-03-13 ENCOUNTER — Emergency Department (HOSPITAL_COMMUNITY)
Admission: EM | Admit: 2022-03-13 | Discharge: 2022-03-13 | Disposition: A | Discharge status: Home / Self Care | Payer: BC Managed Care – PPO | Attending: Emergency Medicine | Admitting: Emergency Medicine

## 2022-03-13 DIAGNOSIS — J181 Lobar pneumonia, unspecified organism: Secondary | ICD-10-CM | POA: Diagnosis not present

## 2022-03-13 DIAGNOSIS — R509 Fever, unspecified: Secondary | ICD-10-CM | POA: Diagnosis present

## 2022-03-13 DIAGNOSIS — R7401 Elevation of levels of liver transaminase levels: Secondary | ICD-10-CM | POA: Diagnosis not present

## 2022-03-13 DIAGNOSIS — Z20822 Contact with and (suspected) exposure to covid-19: Secondary | ICD-10-CM | POA: Diagnosis not present

## 2022-03-13 DIAGNOSIS — E876 Hypokalemia: Secondary | ICD-10-CM | POA: Diagnosis not present

## 2022-03-13 DIAGNOSIS — E860 Dehydration: Secondary | ICD-10-CM | POA: Diagnosis not present

## 2022-03-13 DIAGNOSIS — R7402 Elevation of levels of lactic acid dehydrogenase (LDH): Secondary | ICD-10-CM | POA: Insufficient documentation

## 2022-03-13 DIAGNOSIS — J189 Pneumonia, unspecified organism: Secondary | ICD-10-CM

## 2022-03-13 LAB — LACTIC ACID, PLASMA
Lactic Acid, Venous: 3.1 mmol/L (ref 0.5–1.9)
Lactic Acid, Venous: 2.7 mmol/L (ref 0.5–1.9)

## 2022-03-13 LAB — CBC WITH DIFFERENTIAL/PLATELET
Abs Immature Granulocytes: 0.03 10*3/uL (ref 0.00–0.07)
Basophils Absolute: 0 10*3/uL (ref 0.0–0.1)
Basophils Relative: 0 %
Eosinophils Absolute: 0 10*3/uL (ref 0.0–0.5)
Eosinophils Relative: 0 %
HCT: 35.8 % — ABNORMAL LOW (ref 36.0–46.0)
Hemoglobin: 12.3 g/dL (ref 12.0–15.0)
Immature Granulocytes: 0 %
Lymphocytes Relative: 9 %
Lymphs Abs: 0.8 10*3/uL (ref 0.7–4.0)
MCH: 27.9 pg (ref 26.0–34.0)
MCHC: 34.4 g/dL (ref 30.0–36.0)
MCV: 81.2 fL (ref 80.0–100.0)
Monocytes Absolute: 0.8 10*3/uL (ref 0.1–1.0)
Monocytes Relative: 8 %
Neutro Abs: 8 10*3/uL — ABNORMAL HIGH (ref 1.7–7.7)
Neutrophils Relative %: 83 %
Platelets: 233 10*3/uL (ref 150–400)
RBC: 4.41 MIL/uL (ref 3.87–5.11)
RDW: 12.8 % (ref 11.5–15.5)
WBC: 9.6 10*3/uL (ref 4.0–10.5)
nRBC: 0 % (ref 0.0–0.2)

## 2022-03-13 LAB — URINALYSIS, ROUTINE W REFLEX MICROSCOPIC
Bilirubin Urine: NEGATIVE
Glucose, UA: NEGATIVE mg/dL
Hgb urine dipstick: NEGATIVE
Ketones, ur: NEGATIVE mg/dL
Leukocytes,Ua: NEGATIVE
Nitrite: NEGATIVE
Protein, ur: NEGATIVE mg/dL
Specific Gravity, Urine: 1.009 (ref 1.005–1.030)
pH: 7 (ref 5.0–8.0)

## 2022-03-13 LAB — COMPREHENSIVE METABOLIC PANEL
ALT: 73 U/L — ABNORMAL HIGH (ref 0–44)
AST: 75 U/L — ABNORMAL HIGH (ref 15–41)
Albumin: 3.5 g/dL (ref 3.5–5.0)
Alkaline Phosphatase: 82 U/L (ref 38–126)
Anion gap: 10 (ref 5–15)
BUN: 8 mg/dL (ref 6–20)
CO2: 22 mmol/L (ref 22–32)
Calcium: 8.7 mg/dL — ABNORMAL LOW (ref 8.9–10.3)
Chloride: 105 mmol/L (ref 98–111)
Creatinine, Ser: 0.74 mg/dL (ref 0.44–1.00)
GFR, Estimated: >60 mL/min (ref >60)
Glucose, Bld: 122 mg/dL — ABNORMAL HIGH (ref 70–99)
Potassium: 3.1 mmol/L — ABNORMAL LOW (ref 3.5–5.1)
Sodium: 137 mmol/L (ref 135–145)
Total Bilirubin: 0.5 mg/dL (ref 0.3–1.2)
Total Protein: 6.8 g/dL (ref 6.5–8.1)

## 2022-03-13 LAB — RESP PANEL BY RT-PCR (FLU A&B, COVID) ARPGX2
Influenza A by PCR: NEGATIVE
Influenza B by PCR: NEGATIVE
SARS Coronavirus 2 by RT PCR: NEGATIVE

## 2022-03-13 MED ORDER — CEFPODOXIME PROXETIL 200 MG PO TABS: 200.0000 mg | ORAL_TABLET | Freq: Two times a day (BID) | ORAL | 0 refills | Status: AC

## 2022-03-13 MED ORDER — POTASSIUM CHLORIDE CRYS ER 20 MEQ PO TBCR
40.0000 meq | EXTENDED_RELEASE_TABLET | Freq: Once | ORAL | Status: AC
  Administered 2022-03-13: 40 meq via ORAL
  Filled 2022-03-13: qty 2

## 2022-03-13 MED ORDER — CEFTRIAXONE SODIUM 1 G IJ SOLR
1.0000 g | Freq: Once | INTRAMUSCULAR | Status: AC
  Administered 2022-03-13: 1 g via INTRAVENOUS
  Filled 2022-03-13: qty 10

## 2022-03-13 MED ORDER — ACETAMINOPHEN 325 MG PO TABS
650.0000 mg | ORAL_TABLET | Freq: Once | ORAL | Status: DC
  Filled 2022-03-13: qty 2

## 2022-03-13 MED ORDER — FLUCONAZOLE 150 MG PO TABS: 150.0000 mg | ORAL_TABLET | Freq: Every day | ORAL | 0 refills | Status: AC

## 2022-03-13 MED ORDER — SODIUM CHLORIDE 0.9 % IV BOLUS
1000.0000 mL | Freq: Once | INTRAVENOUS | Status: AC
  Administered 2022-03-13: 1000 mL via INTRAVENOUS

## 2022-03-13 NOTE — ED Triage Notes (Signed)
Patient with complaints of fever, cough, congestion, and vomiting that started the previous Friday. Was seen at the urgent care on Monday and given antibiotics and steroids.  ?

## 2022-03-13 NOTE — ED Notes (Signed)
Pt up to bathroom.

## 2022-03-13 NOTE — ED Notes (Signed)
Date and time results received: 03/13/22 0926 ?(use smartphrase ".now" to insert current time) ? ?Test: lactic ?Critical Value: 3.1 ? ?Name of Provider Notified: Dr. Audley Hose ? ?Orders Received? Or Actions Taken?: no/na ?

## 2022-03-13 NOTE — ED Provider Notes (Signed)
?[facility] ?Provider Note ? ? ?CSN: [id number] ?Arrival date & time: 03/13/22  3419 ? ?  ? ?History ? ?Chief Complaint  ?Patient presents with  ? Fever  ? ? ?Diane Russo is a 26 y.o. female. ? ?Patient presents ER chief complaint of fever and cough ongoing for 5 days.  She was seen at urgent care 3 days ago and given antibiotics and steroids, however has had persistent fever and cough and presents to the ER.  Denies any headache denies any chest pain other than when she coughs.  No diarrhea reported.  2 episodes of vomiting after coughing per patient.  Nonbloody nonbilious. ? ? ?  ? ?Home Medications ?Prior to Admission medications   ?Medication Sig Start Date End Date Taking? Authorizing Provider  ?cefpodoxime (VANTIN) 200 MG tablet Take 1 tablet (200 mg total) by mouth 2 (two) times daily for 7 days. 03/13/22 03/20/22 Yes Cheryll Cockayne, MD  ?doxycycline (VIBRA-TABS) 100 MG tablet Take 100 mg by mouth 2 (two) times daily. 03/11/22  Yes [provider]  ?ELINEST 0.3-30 MG-MCG tablet Take 1 tablet by mouth daily. 02/18/22  Yes [provider]  ?predniSONE (DELTASONE) 5 MG tablet Take 1 tablet by mouth daily. 6,5,4,3,2,1 03/11/22  Yes [provider]  ?promethazine-dextromethorphan (PROMETHAZINE-DM) 6.25-15 MG/5ML syrup Take 5 mLs by mouth 4 (four) times daily as needed for cough. 03/11/22  Yes [provider]  ?aluminum chloride (DRYSOL) 20 % external solution Apply topically at bedtime. ?Patient not taking: Reported on 03/13/2022 01/23/16   Pincus Sanes, MD  ?trimethoprim-polymyxin b (POLYTRIM) ophthalmic solution Place 1 drop into the right eye every 4 (four) hours. ?Patient not taking: Reported on 03/13/2022 02/22/16   Pincus Sanes, MD  ?   ? ?Allergies    ?Azithromycin and Penicillins   ? ?Review of Systems   ?Review of Systems  ?Constitutional:  Positive for fever.  ?HENT:  Negative for ear pain.   ?Eyes:  Negative for pain.  ?Respiratory:  Positive for cough.    ?Cardiovascular:  Negative for leg swelling.  ?Gastrointestinal:  Negative for abdominal pain.  ?Genitourinary:  Negative for flank pain.  ?Musculoskeletal:  Negative for back pain.  ?Skin:  Negative for rash.  ?Neurological:  Negative for headaches.  ? ?Physical Exam ?Updated Vital Signs ?BP 102/62   Pulse 78   Temp 100.2 ?F (37.9 ?C) (Oral)   Resp 20   Ht 5' 4.5" (1.638 m)   Wt 65.8 kg   LMP 03/13/2022   SpO2 100%   BMI 24.50 kg/m?  ?Physical Exam ?Constitutional:   ?   General: She is not in acute distress. ?   Appearance: Normal appearance.  ?HENT:  ?   Head: Normocephalic.  ?   Nose: Nose normal.  ?Eyes:  ?   Extraocular Movements: Extraocular movements intact.  ?Cardiovascular:  ?   Rate and Rhythm: Tachycardia present.  ?Pulmonary:  ?   Effort: Pulmonary effort is normal.  ?Abdominal:  ?   Tenderness: There is no abdominal tenderness. There is no guarding or rebound.  ?Musculoskeletal:     ?   General: Normal range of motion.  ?   Cervical back: Normal range of motion.  ?Neurological:  ?   General: No focal deficit present.  ?   Mental Status: She is alert. Mental status is at baseline.  ? ? ?ED Results / Procedures / Treatments   ?Labs ?(all labs ordered are listed, but only abnormal results are displayed) ?  Labs Reviewed  ?CBC WITH DIFFERENTIAL/PLATELET - Abnormal; Notable for the following components:  ?    Result Value  ? HCT 35.8 (*)   ? Neutro Abs 8.0 (*)   ? All other components within normal limits  ?COMPREHENSIVE METABOLIC PANEL - Abnormal; Notable for the following components:  ? Potassium 3.1 (*)   ? Glucose, Bld 122 (*)   ? Calcium 8.7 (*)   ? AST 75 (*)   ? ALT 73 (*)   ? All other components within normal limits  ?LACTIC ACID, PLASMA - Abnormal; Notable for the following components:  ? Lactic Acid, Venous 3.1 (*)   ? All other components within normal limits  ?LACTIC ACID, PLASMA - Abnormal; Notable for the following components:  ? Lactic Acid, Venous 2.7 (*)   ? All other components  within normal limits  ?CULTURE, BLOOD (ROUTINE X 2)  ?RESP PANEL BY RT-PCR (FLU A&B, COVID) [id number]  ?CULTURE, BLOOD (ROUTINE X 2)  ?URINE CULTURE  ?URINALYSIS, ROUTINE W REFLEX MICROSCOPIC  ? ? ?EKG ?None ? ?Radiology ?DG Chest 2 View ? ?Result Date: 03/13/2022 ?CLINICAL DATA:  Cough and fever EXAM: CHEST - 2 VIEW COMPARISON:  Chest x-ray 04/24/2011 FINDINGS: Heart size and mediastinum are within normal limits. Focal consolidative opacity visualized in the right lower lobe most consistent with pneumonia. Left lung is clear. No pleural effusions or pneumothorax. IMPRESSION: Right lower lobe pneumonia. Electronically Signed   By: Jannifer Hick M.D.   On: 03/13/2022 08:27   ? ?Procedures ?Procedures  ? ? ?Medications Ordered in ED ?Medications  ?acetaminophen (TYLENOL) tablet 650 mg (650 mg Oral Not Given 03/13/22 0938)  ?potassium chloride SA (KLOR-CON M) CR tablet 40 mEq (has no administration in time range)  ?sodium chloride 0.9 % bolus 1,000 mL (0 mLs Intravenous Stopped 03/13/22 1123)  ?sodium chloride 0.9 % bolus 1,000 mL (1,000 mLs Intravenous New Bag/Given 03/13/22 1014)  ?cefTRIAXone (ROCEPHIN) 1 g in sodium chloride 0.9 % 100 mL IVPB (0 g Intravenous Stopped 03/13/22 1123)  ? ? ?ED Course/ Medical Decision Making/ A&P ?Clinical Course as of 03/13/22 1130  ?Wed Mar 13, 2022  ?0932 DG Chest 2 View [[facility]]  ?  ?Clinical Course User Index ?[[facility]] Audley Hose Eustace Moore, MD  ? ?                        ?Medical Decision Making ?Amount and/or Complexity of Data Reviewed ?Labs: ordered. ?Radiology: ordered. Decision-making details documented in ED Course. ? ?Risk ?OTC drugs. ?Prescription drug management. ? ? ?History obtained also from mother at bedside. ? ?Chart review shows outpatient office visit December 14, 2021. ? ?Cardiac monitoring showing sinus rhythm. ? ?Patient was febrile tachycardic on arrival.  However symptoms appear greatly improved with resuscitation. ? ?Labs show normal white count, chemistry shows mild  hypokalemia.  Liver enzymes mildly elevated at 75 and 73.  Lactic acid elevated at 3.1.  After fluid resuscitation appears improved to 2.7. ? ?Patient given Rocephin here which she tolerated well as well as 2 L bolus of fluids.  O2 saturation ranged from 95 to 99% on room air which is appropriate. ? ?1 view chest x-ray read by myself as right lower lobe infiltrate.  No effusion no edema noted. ? ?I feel she stable for outpatient management.  She did appear slightly dehydrated but appears significantly improved with fluid resuscitation.  Recommended continued increase fluid intake at home and close outpatient follow-up with repeat evaluation in 2  to 3 days by primary care team or urgent care.  We will add Augmentin to her antibiotic regimen, she continues on doxycycline which I advised her to continue. ? ?Commending immediate return for difficulty breathing worsening symptoms weakness or any additional concerns. ? ? ? ? ? ? ? ?Final Clinical Impression(s) / ED Diagnoses ?Final diagnoses:  ?Pneumonia of right lower lobe due to infectious organism  ? ? ?Rx / DC Orders ?ED Discharge Orders   ? ?      Ordered  ?  cefpodoxime (VANTIN) 200 MG tablet  2 times daily       ?Note to Pharmacy: Tolerated IV rocephin without issue  ? 03/13/22 1129  ? ?  ?  ? ?  ? ? ?  ?Cheryll CockayneHong, Justus Droke S, MD ?03/13/22 1130 ? ?

## 2022-03-13 NOTE — Discharge Instructions (Signed)
Follow-up with a primary care doctor or clinic in the next 3 to 4 days. ? ?Return immediately back to the ER if your symptoms are worsening despite new antibiotics or if you have difficulty breathing, weakness numbness or any additional concerns. ?

## 2022-03-13 NOTE — ED Notes (Signed)
Pt back from x-ray.

## 2022-03-14 LAB — URINE CULTURE: Culture: NO GROWTH

## 2022-03-16 ENCOUNTER — Ambulatory Visit
Admission: RE | Admit: 2022-03-16 | Discharge: 2022-03-16 | Disposition: A | Discharge status: Home / Self Care | Payer: BC Managed Care – PPO | Source: Ambulatory Visit | Attending: Nurse Practitioner | Admitting: Nurse Practitioner

## 2022-03-16 ENCOUNTER — Ambulatory Visit (INDEPENDENT_AMBULATORY_CARE_PROVIDER_SITE_OTHER): Payer: BC Managed Care – PPO

## 2022-03-16 VITALS — BP 107/75 | HR 105 | Temp 99.1°F | Resp 18

## 2022-03-16 DIAGNOSIS — J189 Pneumonia, unspecified organism: Secondary | ICD-10-CM

## 2022-03-16 DIAGNOSIS — R059 Cough, unspecified: Secondary | ICD-10-CM

## 2022-03-16 NOTE — ED Provider Notes (Signed)
?[facility] ? ? ? ?CSN: [id number] ?Arrival date & time: 03/16/22  1345 ? ? ?  ? ?History   ?Chief Complaint ?Chief Complaint  ?Patient presents with  ? Follow-up  ?  ER visit April 26 for pneumonia, need follow up chest x ray - Entered by patient  ? Appointment  ?  1400  ? ? ?HPI ?Diane Russo is a 26 y.o. female.  ? ?The patient is a 26 year old female who presents for a chest x-ray.  Patient states she was seen in the ER on 4/26 and diagnosed with pneumonia, and was told to follow-up in 3 to 4 days for repeat chest x-ray.  The patient was previously diagnosed with a sinus infection and started on doxycycline.  When she went to the ER, she was started on cefpodoxime and is currently still taking the doxycycline.  The patient's mother states she is continuing to spike fevers.  Her last fever was 1 to 2 days ago and was as high as 102.  She states that she had a low-grade fever around 100 this morning.  The patient continues to have cough, denies shortness of breath, difficulty breathing.  Patient's mother states that they also drew blood cultures but she was awaiting the results.  Denies any new symptoms at this time. ? ?The history is provided by the patient and a parent.  ? ?Past Medical History:  ?Diagnosis Date  ? Depression   ? Hyperlipidemia   ? IBS (irritable bowel syndrome)   ? ? ?Patient Active Problem List  ? Diagnosis Date Noted  ? Anxiety 01/23/2016  ? IBS (irritable bowel syndrome) 01/23/2016  ? Axillary odor 01/23/2016  ? Cervical paraspinal muscle spasm 03/10/2015  ? ? ?Past Surgical History:  ?Procedure Laterality Date  ? WISDOM TOOTH EXTRACTION  08/2014  ? ? ?OB History   ?No obstetric history on file. ?  ? ? ? ?Home Medications   ? ?Prior to Admission medications   ?Medication Sig Start Date End Date Taking? Authorizing Provider  ?aluminum chloride (DRYSOL) 20 % external solution Apply topically at bedtime. ?Patient not taking: Reported on 03/13/2022 01/23/16   Binnie Rail, MD   ?cefpodoxime (VANTIN) 200 MG tablet Take 1 tablet (200 mg total) by mouth 2 (two) times daily for 7 days. 03/13/22 03/20/22  Luna Fuse, MD  ?doxycycline (VIBRA-TABS) 100 MG tablet Take 100 mg by mouth 2 (two) times daily. 03/11/22   [provider]  ?Harvin Hazel 0.3-30 MG-MCG tablet Take 1 tablet by mouth daily. 02/18/22   [provider]  ?predniSONE (DELTASONE) 5 MG tablet Take 1 tablet by mouth daily. 6,5,4,3,2,1 03/11/22   [provider]  ?promethazine-dextromethorphan (PROMETHAZINE-DM) 6.25-15 MG/5ML syrup Take 5 mLs by mouth 4 (four) times daily as needed for cough. 03/11/22   [provider]  ?trimethoprim-polymyxin b (POLYTRIM) ophthalmic solution Place 1 drop into the right eye every 4 (four) hours. ?Patient not taking: Reported on 03/13/2022 02/22/16   Binnie Rail, MD  ? ? ?Family History ?Family History  ?Problem Relation Age of Onset  ? Diabetes Father   ? ? ?Social History ?Social History  ? ?Tobacco Use  ? Smoking status: Every Day  ?  Types: Cigarettes  ?Vaping Use  ? Vaping Use: Every day  ?Substance Use Topics  ? Alcohol use: No  ?  Alcohol/week: 0.0 standard drinks  ? Drug use: No  ? ? ? ?Allergies   ?Azithromycin and Penicillins ? ? ?Review of Systems ?Review of  Systems  ?Constitutional:  Positive for fever.  ?HENT: Negative.    ?Respiratory:  Positive for cough. Negative for shortness of breath and wheezing.   ?Gastrointestinal: Negative.   ?Skin: Negative.   ?Psychiatric/Behavioral: Negative.    ? ? ?Physical Exam ?Triage Vital Signs ?ED Triage Vitals  ?Enc Vitals Group  ?   BP 03/16/22 1428 107/75  ?   Pulse Rate 03/16/22 1428 (!) 105  ?   Resp 03/16/22 1428 18  ?   Temp 03/16/22 1428 99.1 ?F (37.3 ?C)  ?   Temp Source 03/16/22 1428 Oral  ?   SpO2 03/16/22 1428 95 %  ?   Weight --   ?   Height --   ?   Head Circumference --   ?   Peak Flow --   ?   Pain Score 03/16/22 1432 0  ?   Pain Loc --   ?   Pain Edu? --   ?   Excl. in [place]? --   ? ?No data found. ? ?Updated  Vital Signs ?BP 107/75 (BP Location: Right Arm)   Pulse (!) 105   Temp 99.1 ?F (37.3 ?C) (Oral)   Resp 18   LMP 03/11/2022 (Exact Date)   SpO2 95%  ? ?Visual Acuity ?Right Eye Distance:   ?Left Eye Distance:   ?Bilateral Distance:   ? ?Right Eye Near:   ?Left Eye Near:    ?Bilateral Near:    ? ?Physical Exam ?Vitals reviewed.  ?Constitutional:   ?   Appearance: Normal appearance.  ?HENT:  ?   Head: Normocephalic and atraumatic.  ?   Nose: Nose normal.  ?   Mouth/Throat:  ?   Mouth: Mucous membranes are moist.  ?Eyes:  ?   Pupils: Pupils are equal, round, and reactive to light.  ?Cardiovascular:  ?   Rate and Rhythm: Normal rate and regular rhythm.  ?   Pulses: Normal pulses.  ?Pulmonary:  ?   Effort: Pulmonary effort is normal.  ?   Breath sounds: Normal breath sounds.  ?Abdominal:  ?   General: Bowel sounds are normal.  ?   Palpations: Abdomen is soft.  ?Skin: ?   General: Skin is warm and dry.  ?   Capillary Refill: Capillary refill takes less than 2 seconds.  ?Neurological:  ?   General: No focal deficit present.  ?   Mental Status: She is alert and oriented to person, place, and time.  ?Psychiatric:     ?   Mood and Affect: Mood normal.     ?   Behavior: Behavior normal.  ? ? ? ?UC Treatments / Results  ?Labs ?(all labs ordered are listed, but only abnormal results are displayed) ?Labs Reviewed - No data to display ? ?EKG ? ? ?Radiology ?DG Chest 2 View ? ?Result Date: 03/16/2022 ?CLINICAL DATA:  Cough, follow-up pneumonia EXAM: CHEST - 2 VIEW COMPARISON:  03/13/2022 FINDINGS: The heart size and mediastinal contours are within normal limits. Right lower lobe pneumonia with slightly improving aeration compared to the prior study. Left lung is clear. No pleural effusion or pneumothorax. The visualized skeletal structures are unremarkable. IMPRESSION: Right lower lobe pneumonia with slightly improving aeration. Electronically Signed   By: Davina Poke D.O.   On: 03/16/2022 15:05   ? ?Procedures ?Procedures  (including critical care time) ? ?Medications Ordered in UC ?Medications - No data to display ? ?Initial Impression / Assessment and Plan / UC Course  ?I have reviewed the  triage vital signs and the nursing notes. ? ?Pertinent labs & imaging results that were available during my care of the patient were reviewed by me and considered in my medical decision making (see chart for details). ? ?The patient is a 26 year old female who presents for follow-up after being diagnosed with pneumonia on 4/26.  Patient has been taking doxycycline and cefpodoxime for her diagnosis.  She was also given an injection of Rocephin when she was in the ER.  Patient continues to have intermittent fevers.  Fever has gotten as high as 102 within the past 24 to 48 hours.  Repeat chest x-ray was performed which showed improving aeration regarding the pneumonia.  Patient is mildly tachycardic at 105 today.  Her exam is benign at this time.  Would like the patient to continue her antibiotic regimen at this time.  Patient advised to continue Tylenol or ibuprofen for pain, fever, or general discomfort.  Continue supportive care to include increasing fluids and getting plenty of rest.  Advised the patient's mother that the results from the cultures that were collected at the hospital were not released at this time.  Patient was provided information so she can establish access to her MyChart account so she can see her results.  Patient was advised to follow-up in our clinic if she continues to have fevers over the next 48 to 72 hours.  Would like reevaluation at that time.   ? ?Final Clinical Impressions(s) / UC Diagnoses  ? ?Final diagnoses:  ?Pneumonia of right lower lobe due to infectious organism  ? ? ? ?Discharge Instructions   ? ?  ?Your chest x-ray is improving from the 1 you had on 03/13/2022.  It may take a few days for your chest x-ray to be consistent with your resolution of symptoms. ?Continue the antibiotics you are currently  taking. ?Continue ibuprofen or Tylenol for pain, fever, or general discomfort. ?The blood culture results have not been released, therefore I cannot see any results. ?If fever continues to persist within the next 48 to 72 ho

## 2022-03-16 NOTE — ED Triage Notes (Signed)
Pt reports on ad off fever x 1 week, body aches. States she  diagnosed with pneumonia 03/13/2022 at the ED.  Pt taking doxycyline cefpodoxime.  Pt finished prednisone today. Pt was told to have a xray follow up 3-4 days after the ED visit.  ?

## 2022-03-16 NOTE — Discharge Instructions (Signed)
Your chest x-ray is improving from the 1 you had on 03/13/2022.  It may take a few days for your chest x-ray to be consistent with your resolution of symptoms. ?Continue the antibiotics you are currently taking. ?Continue ibuprofen or Tylenol for pain, fever, or general discomfort. ?The blood culture results have not been released, therefore I cannot see any results. ?If fever continues to persist within the next 48 to 72 hours, follow-up. ?Follow-up as needed. ? ?

## 2022-03-18 LAB — CULTURE, BLOOD (ROUTINE X 2)
Culture: NO GROWTH
Culture: NO GROWTH

## 2022-10-18 DIAGNOSIS — Z419 Encounter for procedure for purposes other than remedying health state, unspecified: Secondary | ICD-10-CM | POA: Diagnosis not present

## 2022-11-12 ENCOUNTER — Ambulatory Visit
Admission: EM | Admit: 2022-11-12 | Discharge: 2022-11-12 | Disposition: A | Discharge status: Home / Self Care | Payer: BC Managed Care – PPO | Attending: Family Medicine | Admitting: Family Medicine

## 2022-11-12 DIAGNOSIS — J111 Influenza due to unidentified influenza virus with other respiratory manifestations: Secondary | ICD-10-CM | POA: Diagnosis not present

## 2022-11-12 MED ORDER — OSELTAMIVIR PHOSPHATE 75 MG PO CAPS: 75.0000 mg | ORAL_CAPSULE | Freq: Two times a day (BID) | ORAL | 0 refills | Status: AC

## 2022-11-12 MED ORDER — FLUTICASONE PROPIONATE 50 MCG/ACT NA SUSP: 1.0000 | Freq: Two times a day (BID) | NASAL | 2 refills | Status: AC

## 2022-11-12 MED ORDER — PROMETHAZINE-DM 6.25-15 MG/5ML PO SYRP: 5.0000 mL | ORAL_SOLUTION | Freq: Four times a day (QID) | ORAL | 0 refills | Status: AC | PRN

## 2022-11-12 NOTE — ED Triage Notes (Signed)
Fever 101.7 at home this morning took tylenol before coming here today. Cough, body aches, chills, headache that started yesterday. Family member was positive for the flu Sunday.

## 2022-11-12 NOTE — ED Provider Notes (Signed)
[facility]    CSN: [id number] Arrival date & time: 11/12/22  2637      History   Chief Complaint Chief Complaint  Patient presents with   Cough   Generalized Body Aches   Chills   Headache   Sore Throat   Fever    HPI Diane Russo is a 26 y.o. female.   Patient presenting today with 1 day history of fever, chills, body aches, sweats, headache, cough, congestion.  Denies chest pain, shortness of breath, abdominal pain, nausea vomiting or diarrhea.  Multiple sick contacts last few days with influenza.  So far taking Tylenol with minimal relief.    Past Medical History:  Diagnosis Date   Depression    Hyperlipidemia    IBS (irritable bowel syndrome)     Patient Active Problem List   Diagnosis Date Noted   Anxiety 01/23/2016   IBS (irritable bowel syndrome) 01/23/2016   Axillary odor 01/23/2016   Cervical paraspinal muscle spasm 03/10/2015    Past Surgical History:  Procedure Laterality Date   WISDOM TOOTH EXTRACTION  08/2014    OB History   No obstetric history on file.      Home Medications    Prior to Admission medications   Medication Sig Start Date End Date Taking? Authorizing Provider  fluticasone (FLONASE) 50 MCG/ACT nasal spray Place 1 spray into both nostrils 2 (two) times daily. 11/12/22  Yes Particia Nearing, PA-C  oseltamivir (TAMIFLU) 75 MG capsule Take 1 capsule (75 mg total) by mouth every 12 (twelve) hours. 11/12/22  Yes Particia Nearing, PA-C  promethazine-dextromethorphan (PROMETHAZINE-DM) 6.25-15 MG/5ML syrup Take 5 mLs by mouth 4 (four) times daily as needed. 11/12/22  Yes Particia Nearing, PA-C  aluminum chloride (DRYSOL) 20 % external solution Apply topically at bedtime. Patient not taking: Reported on 03/13/2022 01/23/16   Pincus Sanes, MD  doxycycline (VIBRA-TABS) 100 MG tablet Take 100 mg by mouth 2 (two) times daily. 03/11/22   [provider]  ELINEST 0.3-30 MG-MCG tablet Take 1 tablet by  mouth daily. 02/18/22   [provider]  predniSONE (DELTASONE) 5 MG tablet Take 1 tablet by mouth daily. 6,5,4,3,2,1 03/11/22   [provider]  promethazine-dextromethorphan (PROMETHAZINE-DM) 6.25-15 MG/5ML syrup Take 5 mLs by mouth 4 (four) times daily as needed for cough. 03/11/22   [provider]  trimethoprim-polymyxin b (POLYTRIM) ophthalmic solution Place 1 drop into the right eye every 4 (four) hours. Patient not taking: Reported on 03/13/2022 02/22/16   Pincus Sanes, MD    Family History Family History  Problem Relation Age of Onset   Diabetes Father     Social History Social History   Tobacco Use   Smoking status: Every Day    Types: Cigarettes  Vaping Use   Vaping Use: Every day  Substance Use Topics   Alcohol use: No    Alcohol/week: 0.0 standard drinks of alcohol   Drug use: No     Allergies   Azithromycin and Penicillins   Review of Systems Review of Systems Per HPI  Physical Exam Triage Vital Signs ED Triage Vitals  Enc Vitals Group     BP 11/12/22 1025 113/83     Pulse Rate 11/12/22 1025 (!) 103     Resp 11/12/22 1025 16     Temp 11/12/22 1025 98.8 F (37.1 C)     Temp Source 11/12/22 1025 Oral     SpO2 11/12/22 1025 98 %     Weight --  Height --      Head Circumference --      Peak Flow --      Pain Score 11/12/22 1026 0     Pain Loc --      Pain Edu? --      Excl. in [place]? --    No data found.  Updated Vital Signs BP 113/83 (BP Location: Right Arm)   Pulse (!) 103   Temp 98.8 F (37.1 C) (Oral)   Resp 16   LMP 11/04/2022 (Exact Date)   SpO2 98%   Visual Acuity Right Eye Distance:   Left Eye Distance:   Bilateral Distance:    Right Eye Near:   Left Eye Near:    Bilateral Near:     Physical Exam Vitals and nursing note reviewed.  Constitutional:      Appearance: Normal appearance.  HENT:     Head: Atraumatic.     Right Ear: Tympanic membrane and external ear normal.     Left Ear: Tympanic  membrane and external ear normal.     Nose: Rhinorrhea present.     Mouth/Throat:     Mouth: Mucous membranes are moist.     Pharynx: Posterior oropharyngeal erythema present.  Eyes:     Extraocular Movements: Extraocular movements intact.     Conjunctiva/sclera: Conjunctivae normal.  Cardiovascular:     Rate and Rhythm: Normal rate and regular rhythm.     Heart sounds: Normal heart sounds.  Pulmonary:     Effort: Pulmonary effort is normal.     Breath sounds: Normal breath sounds. No wheezing.  Musculoskeletal:        General: Normal range of motion.     Cervical back: Normal range of motion and neck supple.  Skin:    General: Skin is warm and dry.  Neurological:     Mental Status: She is alert and oriented to person, place, and time.  Psychiatric:        Mood and Affect: Mood normal.        Thought Content: Thought content normal.      UC Treatments / Results  Labs (all labs ordered are listed, but only abnormal results are displayed) Labs Reviewed - No data to display  EKG   Radiology No results found.  Procedures Procedures (including critical care time)  Medications Ordered in UC Medications - No data to display  Initial Impression / Assessment and Plan / UC Course  I have reviewed the triage vital signs and the nursing notes.  Pertinent labs & imaging results that were available during my care of the patient were reviewed by me and considered in my medical decision making (see chart for details).     Consistent with influenza, treat with Tamiflu, Phenergan DM, Flonase, supportive over-the-counter medications and home care.  Return for worsening symptoms.  Work note given.  Final Clinical Impressions(s) / UC Diagnoses   Final diagnoses:  Influenza   Discharge Instructions   None    ED Prescriptions     Medication Sig Dispense Auth. Provider   oseltamivir (TAMIFLU) 75 MG capsule Take 1 capsule (75 mg total) by mouth every 12 (twelve) hours. 10  capsule Particia Nearing, [place]   promethazine-dextromethorphan (PROMETHAZINE-DM) 6.25-15 MG/5ML syrup Take 5 mLs by mouth 4 (four) times daily as needed. 100 mL Particia Nearing, PA-C   fluticasone [facility]) 50 MCG/ACT nasal spray Place 1 spray into both nostrils 2 (two) times daily. 16 g Particia Nearing, [place]  PDMP not reviewed this encounter.   Particia Nearing, [place] 11/12/22 1142

## 2022-11-18 DIAGNOSIS — Z419 Encounter for procedure for purposes other than remedying health state, unspecified: Secondary | ICD-10-CM | POA: Diagnosis not present

## 2022-12-19 DIAGNOSIS — Z419 Encounter for procedure for purposes other than remedying health state, unspecified: Secondary | ICD-10-CM | POA: Diagnosis not present

## 2023-01-17 DIAGNOSIS — Z419 Encounter for procedure for purposes other than remedying health state, unspecified: Secondary | ICD-10-CM | POA: Diagnosis not present

## 2023-02-17 DIAGNOSIS — Z419 Encounter for procedure for purposes other than remedying health state, unspecified: Secondary | ICD-10-CM | POA: Diagnosis not present

## 2023-03-19 DIAGNOSIS — Z419 Encounter for procedure for purposes other than remedying health state, unspecified: Secondary | ICD-10-CM | POA: Diagnosis not present

## 2023-04-19 DIAGNOSIS — Z419 Encounter for procedure for purposes other than remedying health state, unspecified: Secondary | ICD-10-CM | POA: Diagnosis not present

## 2023-04-24 IMAGING — DX DG CHEST 2V
2 series · 2 of 2 positions shown · non-contrast
Comparison: Chest x-ray 04/24/2011

CLINICAL DATA: Cough and fever

EXAM:
CHEST - 2 VIEW

[chest pa]
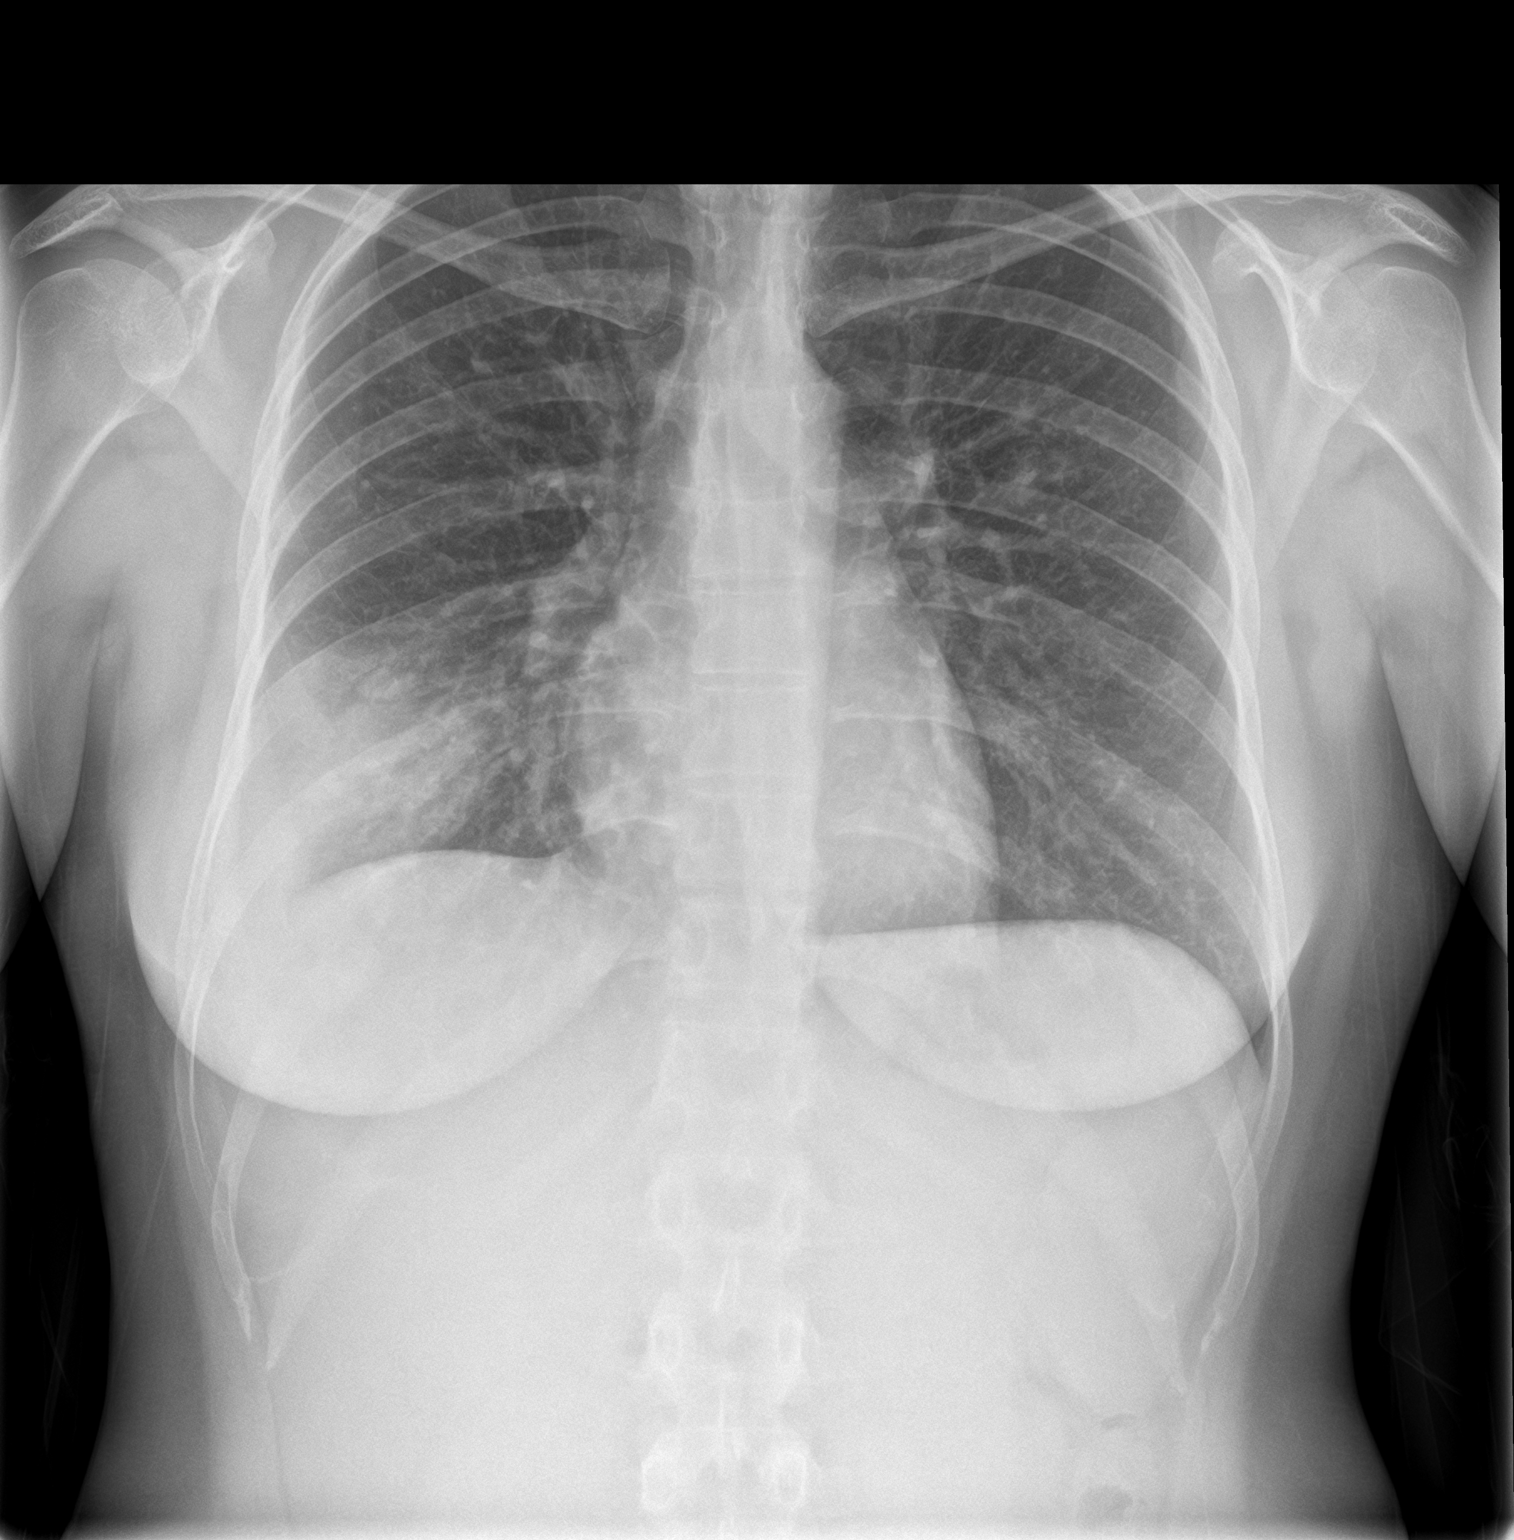

[chest lat]
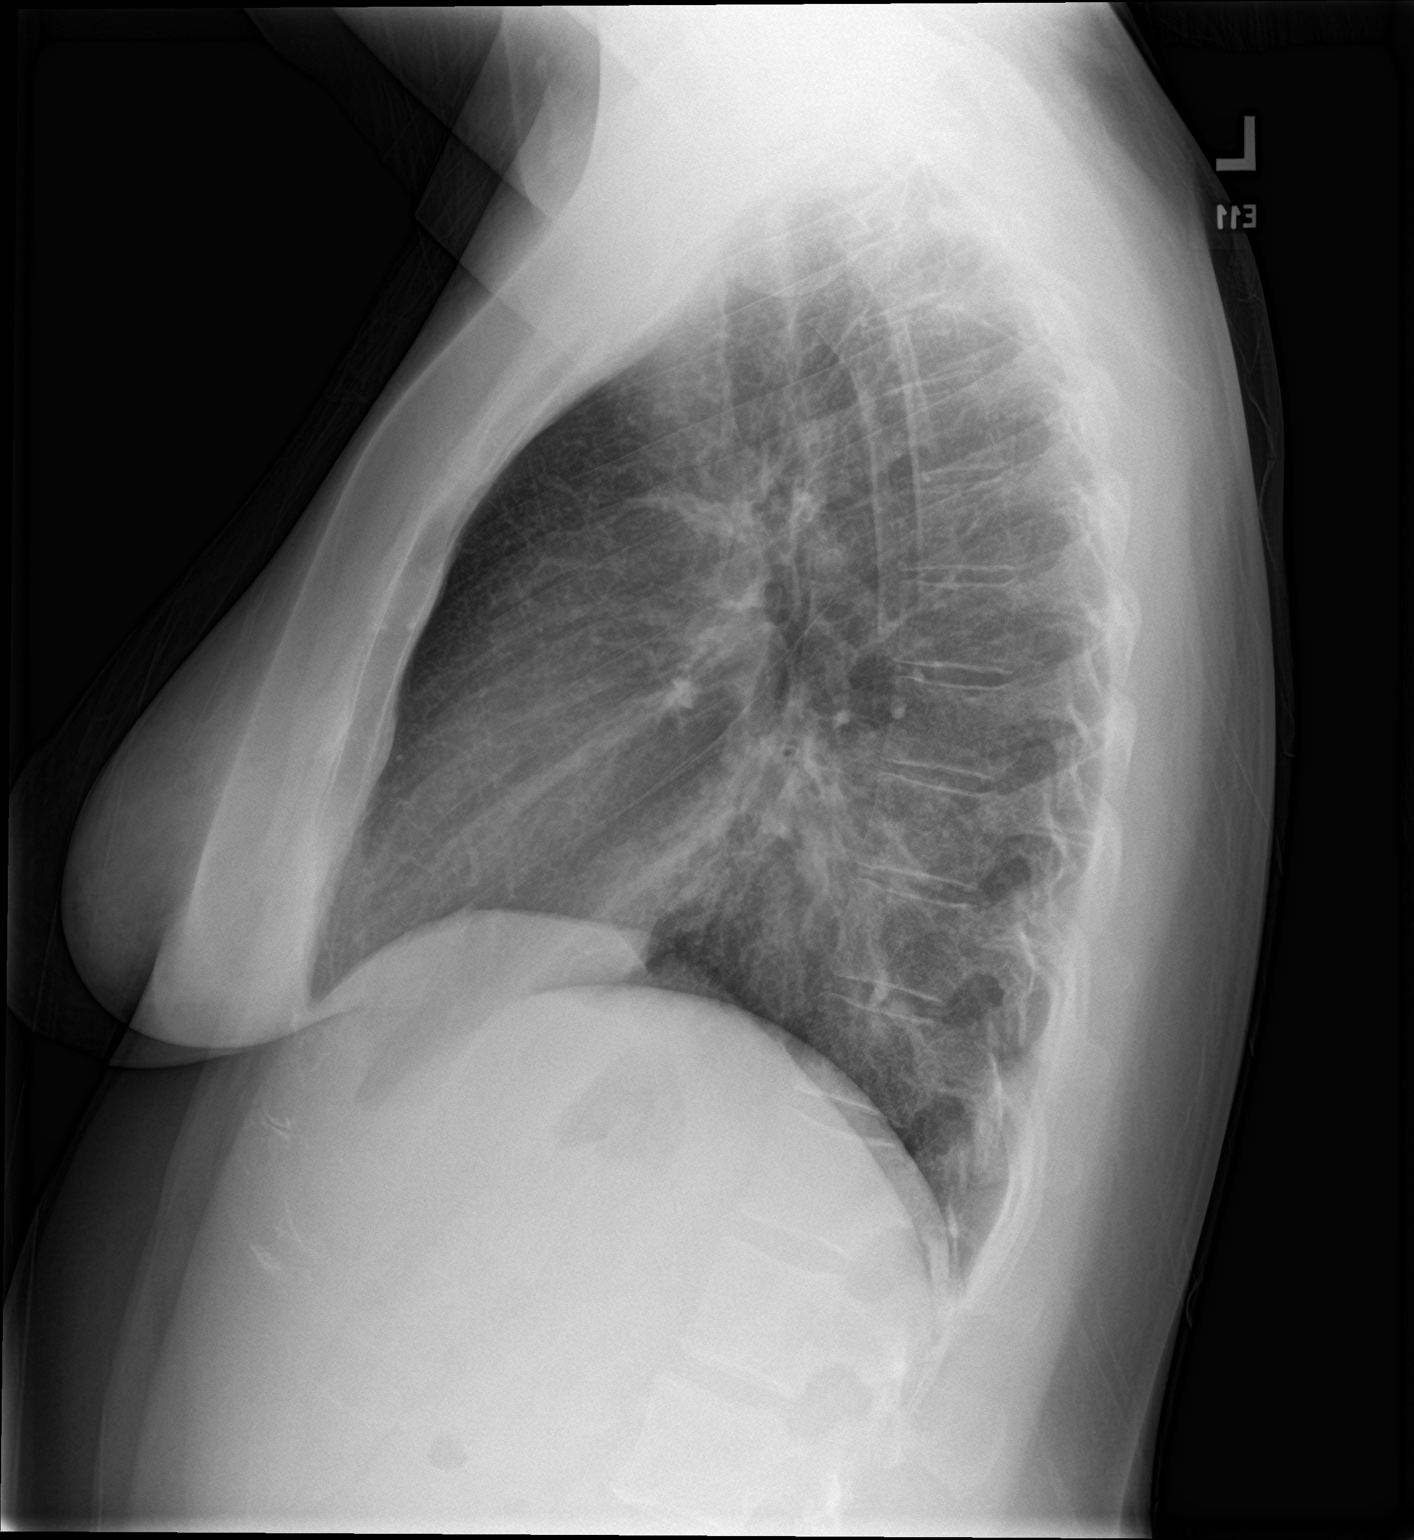

[2 of 2 positions shown; findings below may reference images not displayed]

FINDINGS: Heart size and mediastinum are within normal limits. Focal
consolidative opacity visualized in the right lower lobe most
consistent with pneumonia. Left lung is clear. No pleural effusions
or pneumothorax.
IMPRESSION: Right lower lobe pneumonia.

## 2023-04-27 IMAGING — DX DG CHEST 2V
2 series · 2 of 2 positions shown · non-contrast
Comparison: 03/13/2022

CLINICAL DATA: Cough, follow-up pneumonia

EXAM:
CHEST - 2 VIEW

[chest pa]
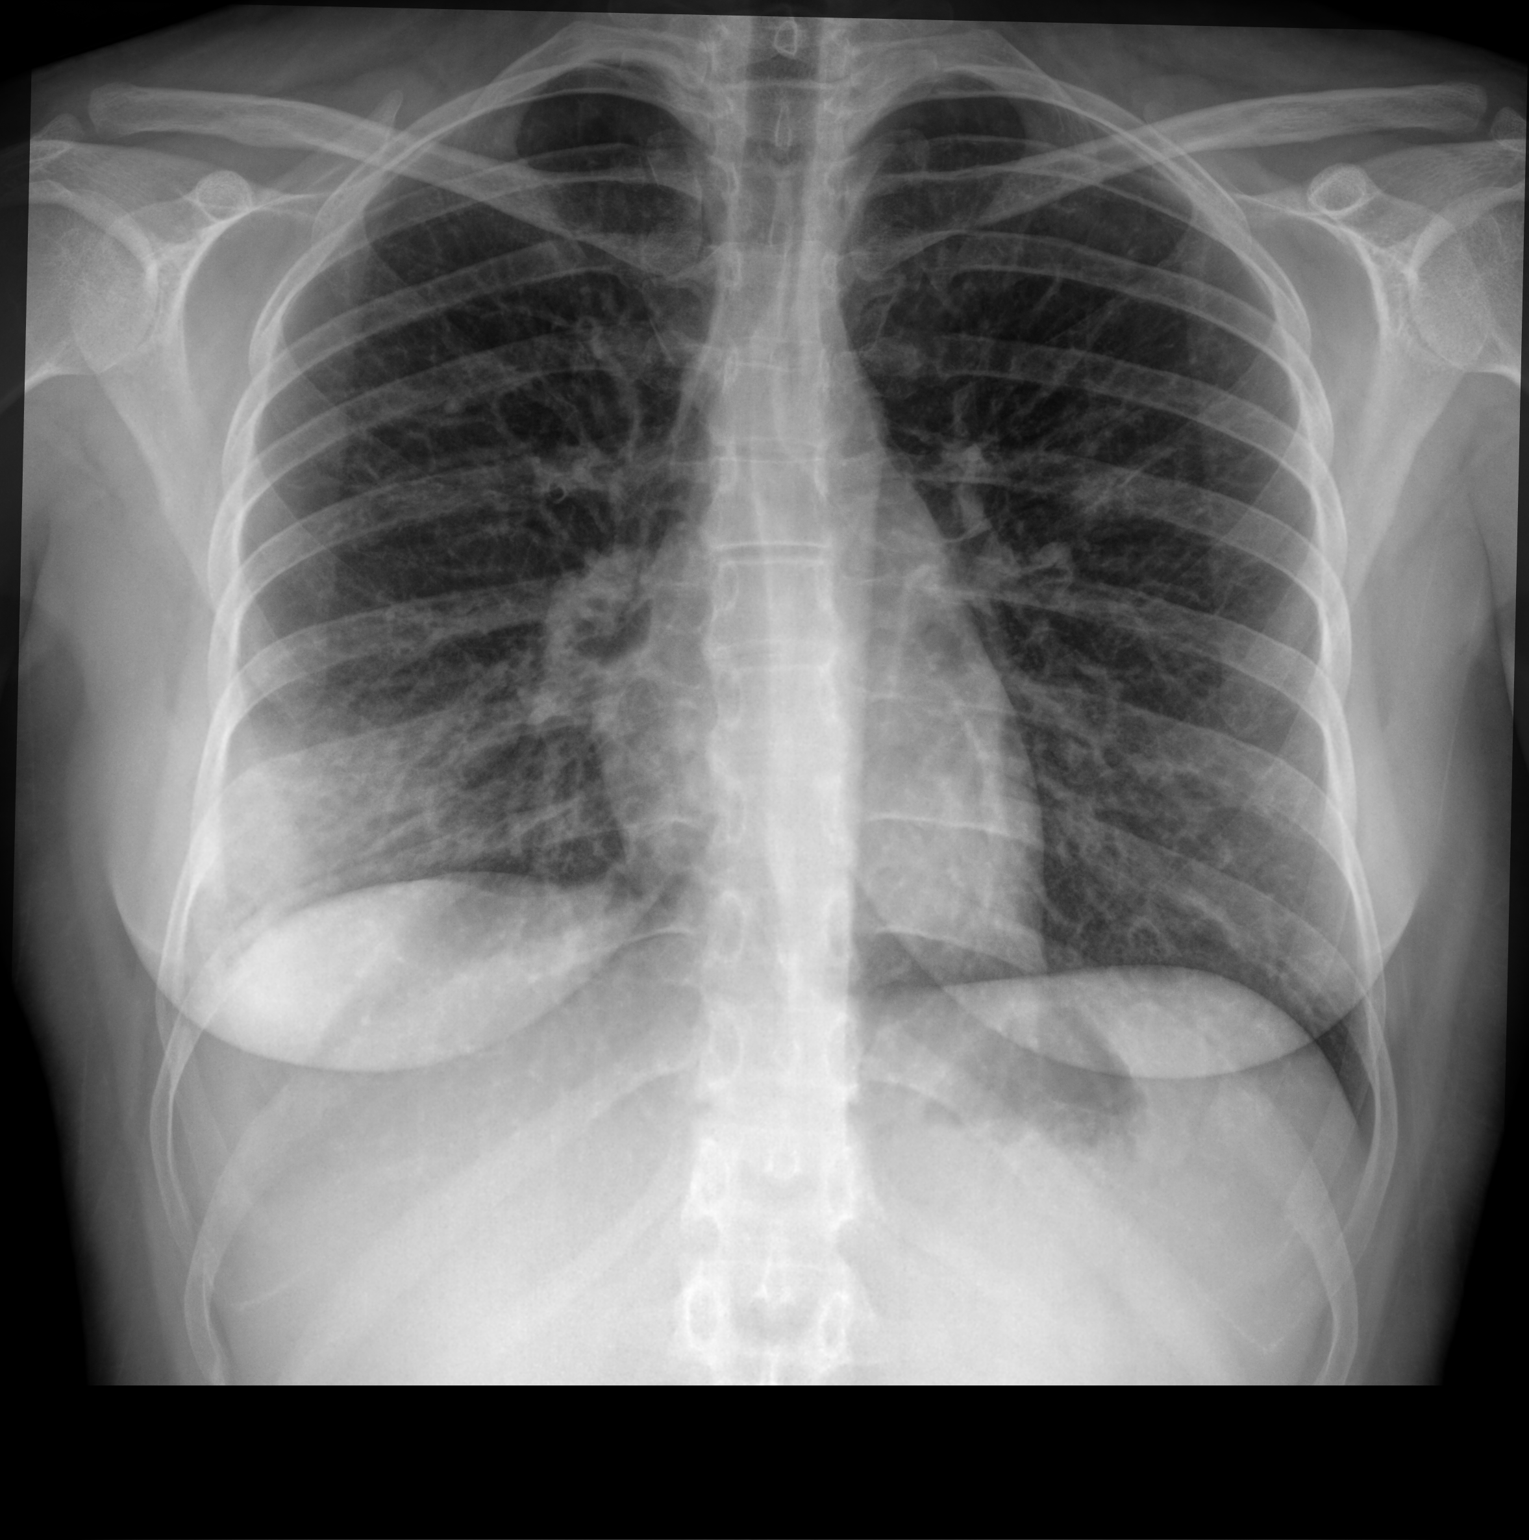

[chest lat]
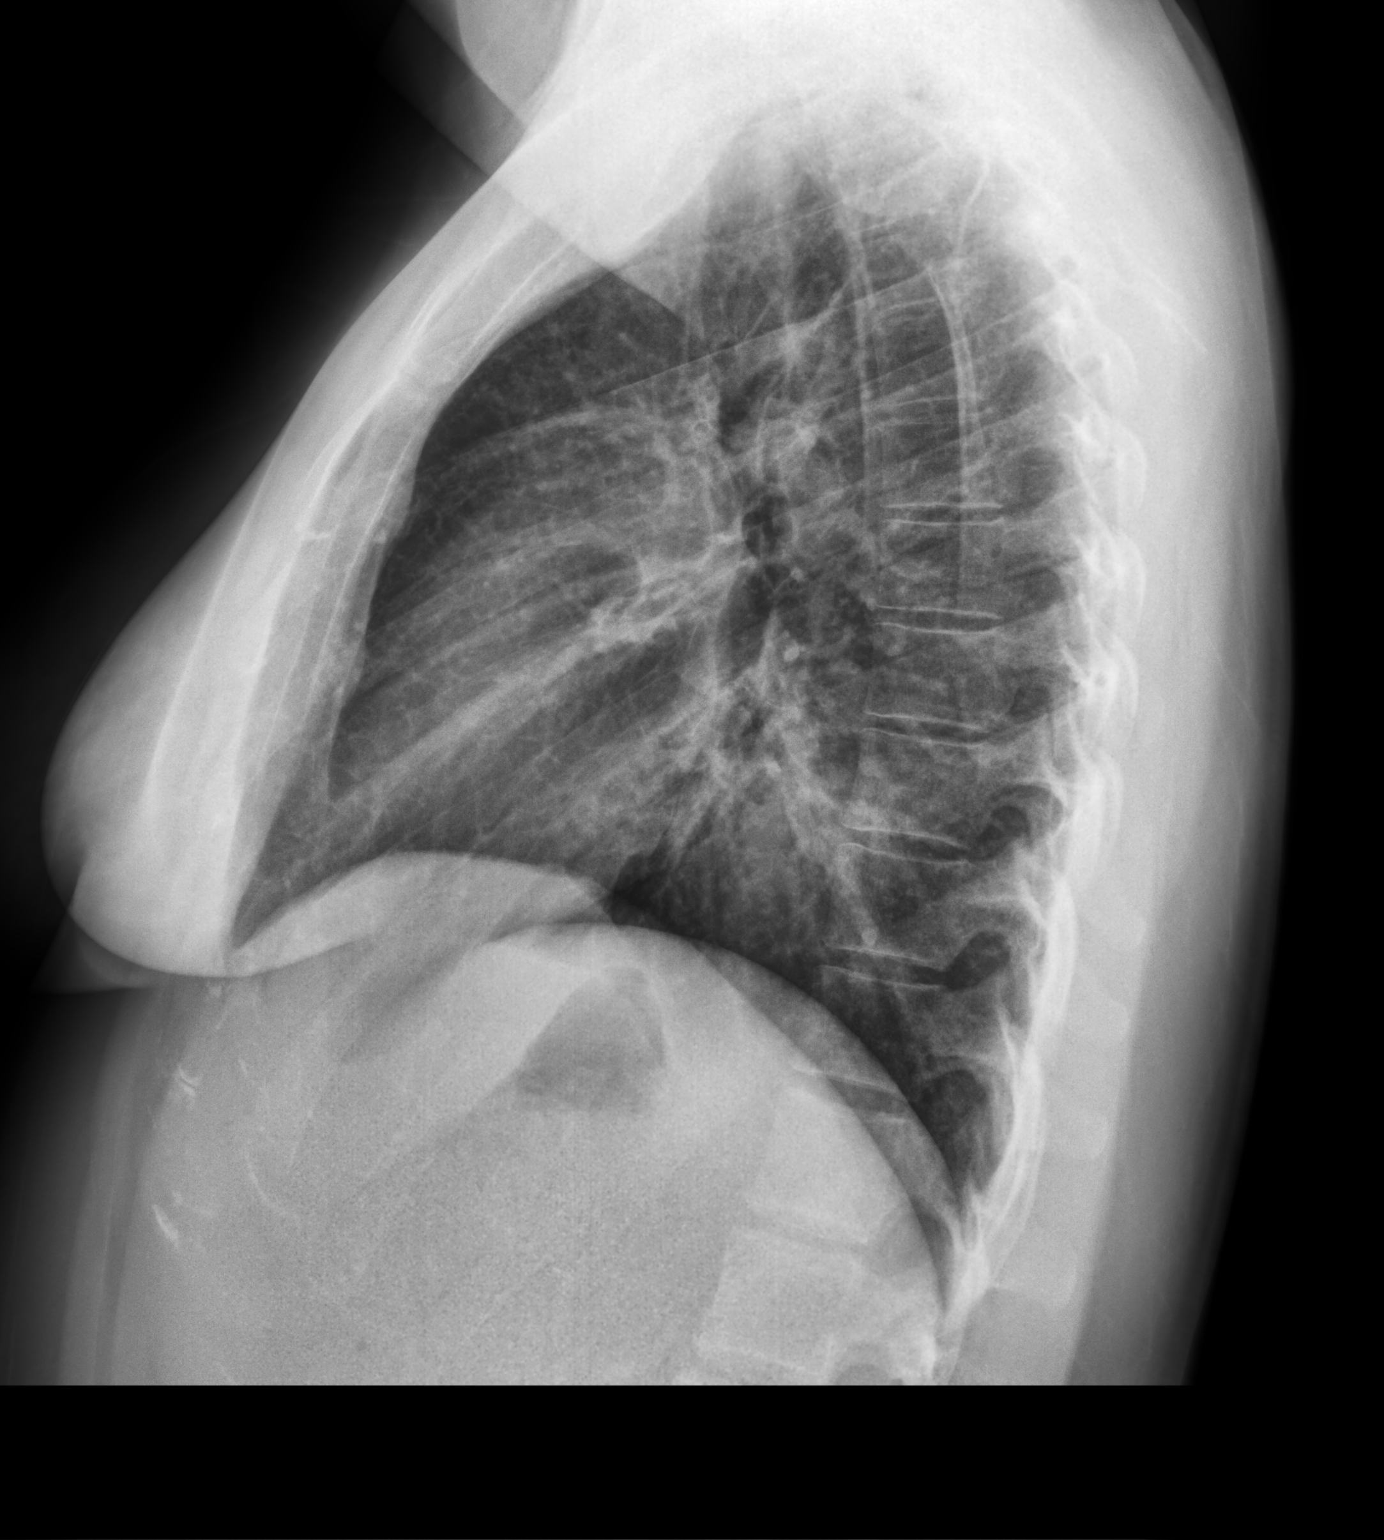

[2 of 2 positions shown; findings below may reference images not displayed]

FINDINGS: The heart size and mediastinal contours are within normal limits.
Right lower lobe pneumonia with slightly improving aeration compared
to the prior study. Left lung is clear. No pleural effusion or
pneumothorax. The visualized skeletal structures are unremarkable.
IMPRESSION: Right lower lobe pneumonia with slightly improving aeration.

## 2023-05-19 DIAGNOSIS — Z419 Encounter for procedure for purposes other than remedying health state, unspecified: Secondary | ICD-10-CM | POA: Diagnosis not present

## 2023-06-19 DIAGNOSIS — Z419 Encounter for procedure for purposes other than remedying health state, unspecified: Secondary | ICD-10-CM | POA: Diagnosis not present

## 2023-07-20 DIAGNOSIS — Z419 Encounter for procedure for purposes other than remedying health state, unspecified: Secondary | ICD-10-CM | POA: Diagnosis not present

## 2023-08-19 DIAGNOSIS — Z419 Encounter for procedure for purposes other than remedying health state, unspecified: Secondary | ICD-10-CM | POA: Diagnosis not present

## 2023-09-19 DIAGNOSIS — Z419 Encounter for procedure for purposes other than remedying health state, unspecified: Secondary | ICD-10-CM | POA: Diagnosis not present

## 2023-10-19 DIAGNOSIS — Z419 Encounter for procedure for purposes other than remedying health state, unspecified: Secondary | ICD-10-CM | POA: Diagnosis not present

## 2023-11-19 DIAGNOSIS — Z419 Encounter for procedure for purposes other than remedying health state, unspecified: Secondary | ICD-10-CM | POA: Diagnosis not present

## 2023-12-20 DIAGNOSIS — Z419 Encounter for procedure for purposes other than remedying health state, unspecified: Secondary | ICD-10-CM | POA: Diagnosis not present

## 2024-01-17 DIAGNOSIS — Z419 Encounter for procedure for purposes other than remedying health state, unspecified: Secondary | ICD-10-CM | POA: Diagnosis not present

## 2024-02-28 DIAGNOSIS — Z419 Encounter for procedure for purposes other than remedying health state, unspecified: Secondary | ICD-10-CM | POA: Diagnosis not present

## 2024-03-29 DIAGNOSIS — Z419 Encounter for procedure for purposes other than remedying health state, unspecified: Secondary | ICD-10-CM | POA: Diagnosis not present

## 2024-04-29 DIAGNOSIS — Z419 Encounter for procedure for purposes other than remedying health state, unspecified: Secondary | ICD-10-CM | POA: Diagnosis not present

## 2024-05-29 DIAGNOSIS — Z419 Encounter for procedure for purposes other than remedying health state, unspecified: Secondary | ICD-10-CM | POA: Diagnosis not present

## 2024-06-29 DIAGNOSIS — Z419 Encounter for procedure for purposes other than remedying health state, unspecified: Secondary | ICD-10-CM | POA: Diagnosis not present

## 2024-07-30 DIAGNOSIS — Z419 Encounter for procedure for purposes other than remedying health state, unspecified: Secondary | ICD-10-CM | POA: Diagnosis not present

## 2024-08-29 DIAGNOSIS — Z419 Encounter for procedure for purposes other than remedying health state, unspecified: Secondary | ICD-10-CM | POA: Diagnosis not present
